# Patient Record
Sex: Male | Born: 1945
Health system: Southern US, Community
[De-identification: ages and names within clinical notes are randomized; demographics above are authoritative.]

## PROBLEM LIST (undated history)

## (undated) DIAGNOSIS — K5790 Diverticulosis of intestine, part unspecified, without perforation or abscess without bleeding: Secondary | ICD-10-CM

## (undated) DIAGNOSIS — J8410 Pulmonary fibrosis, unspecified: Secondary | ICD-10-CM

## (undated) DIAGNOSIS — N451 Epididymitis: Secondary | ICD-10-CM

## (undated) DIAGNOSIS — M48061 Spinal stenosis, lumbar region without neurogenic claudication: Secondary | ICD-10-CM

## (undated) DIAGNOSIS — D126 Benign neoplasm of colon, unspecified: Secondary | ICD-10-CM

## (undated) DIAGNOSIS — D3500 Benign neoplasm of unspecified adrenal gland: Secondary | ICD-10-CM

## (undated) DIAGNOSIS — K649 Unspecified hemorrhoids: Secondary | ICD-10-CM

## (undated) DIAGNOSIS — K648 Other hemorrhoids: Secondary | ICD-10-CM

## (undated) DIAGNOSIS — R7303 Prediabetes: Principal | ICD-10-CM

## (undated) DIAGNOSIS — D1803 Hemangioma of intra-abdominal structures: Secondary | ICD-10-CM

## (undated) DIAGNOSIS — N200 Calculus of kidney: Secondary | ICD-10-CM

## (undated) DIAGNOSIS — M722 Plantar fascial fibromatosis: Secondary | ICD-10-CM

## (undated) HISTORY — DX: Benign neoplasm of unspecified adrenal gland: D35.00

## (undated) HISTORY — DX: Spinal stenosis, lumbar region without neurogenic claudication: M48.061

## (undated) HISTORY — DX: Epididymitis: N45.1

## (undated) HISTORY — DX: Diverticulosis of intestine, part unspecified, without perforation or abscess without bleeding: K57.90

## (undated) HISTORY — DX: Calculus of kidney: N20.0

## (undated) HISTORY — DX: Prediabetes: R73.03

## (undated) HISTORY — DX: Benign neoplasm of colon, unspecified: D12.6

## (undated) HISTORY — PX: POLYPECTOMY: SHX149

## (undated) HISTORY — DX: Other hemorrhoids: K64.8

## (undated) HISTORY — PX: OTHER SURGICAL HISTORY: SHX169

## (undated) HISTORY — PX: COLONOSCOPY: SHX174

## (undated) HISTORY — DX: Unspecified hemorrhoids: K64.9

## (undated) HISTORY — DX: Hemangioma of intra-abdominal structures: D18.03

## (undated) HISTORY — DX: Pulmonary fibrosis, unspecified: J84.10

## (undated) HISTORY — DX: Plantar fascial fibromatosis: M72.2

---

## 2005-03-20 DIAGNOSIS — M48061 Spinal stenosis, lumbar region without neurogenic claudication: Secondary | ICD-10-CM

## 2005-03-20 HISTORY — DX: Spinal stenosis, lumbar region without neurogenic claudication: M48.061

## 2008-07-21 DIAGNOSIS — N451 Epididymitis: Secondary | ICD-10-CM

## 2008-07-21 DIAGNOSIS — M722 Plantar fascial fibromatosis: Secondary | ICD-10-CM

## 2008-07-21 HISTORY — DX: Epididymitis: N45.1

## 2008-07-21 HISTORY — DX: Plantar fascial fibromatosis: M72.2

## 2012-02-27 ENCOUNTER — Telehealth: Payer: Self-pay | Admitting: Internal Medicine

## 2012-02-27 ENCOUNTER — Encounter: Payer: Self-pay | Admitting: *Deleted

## 2012-02-27 NOTE — Telephone Encounter (Signed)
Received medical records from Dr. Claybon Jabs

## 2012-03-02 ENCOUNTER — Ambulatory Visit: Payer: Self-pay | Admitting: Internal Medicine

## 2012-03-15 ENCOUNTER — Ambulatory Visit (INDEPENDENT_AMBULATORY_CARE_PROVIDER_SITE_OTHER): Payer: Medicare Other | Admitting: Internal Medicine

## 2012-03-15 ENCOUNTER — Encounter: Payer: Self-pay | Admitting: Internal Medicine

## 2012-03-15 VITALS — BP 124/82 | HR 63 | Temp 98.4°F | Resp 16 | Ht 67.0 in | Wt 170.2 lb

## 2012-03-15 DIAGNOSIS — Z1211 Encounter for screening for malignant neoplasm of colon: Secondary | ICD-10-CM | POA: Insufficient documentation

## 2012-03-15 DIAGNOSIS — R7309 Other abnormal glucose: Secondary | ICD-10-CM | POA: Diagnosis not present

## 2012-03-15 DIAGNOSIS — R739 Hyperglycemia, unspecified: Secondary | ICD-10-CM | POA: Insufficient documentation

## 2012-03-15 DIAGNOSIS — R52 Pain, unspecified: Secondary | ICD-10-CM | POA: Diagnosis not present

## 2012-03-15 DIAGNOSIS — R1032 Left lower quadrant pain: Secondary | ICD-10-CM

## 2012-03-15 DIAGNOSIS — R7303 Prediabetes: Secondary | ICD-10-CM

## 2012-03-15 DIAGNOSIS — E559 Vitamin D deficiency, unspecified: Secondary | ICD-10-CM | POA: Diagnosis not present

## 2012-03-15 DIAGNOSIS — Z125 Encounter for screening for malignant neoplasm of prostate: Secondary | ICD-10-CM

## 2012-03-15 DIAGNOSIS — L989 Disorder of the skin and subcutaneous tissue, unspecified: Secondary | ICD-10-CM

## 2012-03-15 DIAGNOSIS — Z23 Encounter for immunization: Secondary | ICD-10-CM

## 2012-03-15 HISTORY — DX: Prediabetes: R73.03

## 2012-03-15 LAB — HEMOGLOBIN A1C: Hgb A1c MFr Bld: 6 % — ABNORMAL HIGH (ref <5.7)

## 2012-03-15 LAB — HEPATIC FUNCTION PANEL
ALT: 12 U/L (ref 0–53)
Bilirubin, Direct: 0.2 mg/dL (ref 0.0–0.3)
Total Bilirubin: 1.2 mg/dL (ref 0.3–1.2)

## 2012-03-15 LAB — CBC WITH DIFFERENTIAL/PLATELET
Basophils Absolute: 0 10*3/uL (ref 0.0–0.1)
Basophils Relative: 0 % (ref 0–1)
Eosinophils Relative: 2 % (ref 0–5)
HCT: 43.8 % (ref 39.0–52.0)
MCHC: 33.3 g/dL (ref 30.0–36.0)
MCV: 93 fL (ref 78.0–100.0)
Monocytes Absolute: 0.5 10*3/uL (ref 0.1–1.0)
RDW: 13 % (ref 11.5–15.5)

## 2012-03-15 LAB — BASIC METABOLIC PANEL
BUN: 20 mg/dL (ref 6–23)
Creat: 0.99 mg/dL (ref 0.50–1.35)

## 2012-03-15 MED ORDER — AMBIEN 10 MG PO TABS: 10.0000 mg | ORAL_TABLET | Freq: Every evening | ORAL | Status: DC | PRN

## 2012-03-15 NOTE — Assessment & Plan Note (Signed)
Obtain CBC, Chem-7, liver function test and urinalysis. Refer to GI for updated colonoscopy. Pending lab results have discussed possible abdominal pelvic CT for further evaluation. Close followup scheduled

## 2012-03-15 NOTE — Assessment & Plan Note (Signed)
-   Obtain vitamin D level

## 2012-03-15 NOTE — Assessment & Plan Note (Signed)
Obtain Chem-7 and A1c 

## 2012-03-15 NOTE — Progress Notes (Signed)
  Subjective:    Patient ID: Daniel Rasmussen, male    DOB: 1945-12-10, 66 y.o.   MRN: [id number]  HPI patient presents to clinic to establish care and for evaluation of left lower cord and abdominal pain. Notes low-level discomfort persistent left lower quadrant for over one year. Pain does not radiate and is not associated with any change in bowel habits. Denies hematochezia or melena. Recalls [place]. Reviewed outside labs dated two thousand eleven with normal CBC, LDL, creatinine, TSH, free T4 and PSA of point nine eight. A1c mildly elevated at six point two and vitamin D level depressed at seventeen. Not currently taking vitamin D supplementation. Has history of elevated bilirubin last known to be one point five. Previously scheduled for abdominal ultrasound by PMD but was not performed. States does drink wine on a daily basis. Request refill of Ambien taken when necessary for insomnia without adverse effect.  Past Medical History  Diagnosis Date  . Renal calculi   . Lumbar strain   . Spinal stenosis of lumbar region 10.2006    L4-L5, Mild discogenic disease  . Borderline hypertension 11.2006  . Hemorrhoid     Inflammation  . Sprain of medial meniscus 02.2010    Right  . Plantar fasciitis 02.2010  . Epididymitis 02.2010   No past surgical history on file.  reports that he has quit smoking. He does not have any smokeless tobacco history on file. His alcohol and drug histories not on file. family history includes Aortic aneurysm in his father; Breast cancer in his mother; and Healthy in his sister.  There is no history of Colon cancer and Prostate cancer. No Known Allergies  Review of Systems  Gastrointestinal: Positive for abdominal pain. Negative for diarrhea, constipation and blood in stool.  Skin: Negative for rash.  All other systems reviewed and are negative.       Objective:   Physical Exam  Nursing note and vitals  reviewed. Constitutional: He appears well-developed and well-nourished. No distress.  HENT:  Head: Normocephalic and atraumatic.  Right Ear: External ear normal.  Left Ear: External ear normal.  Eyes: Conjunctivae normal are normal. No scleral icterus.  Neck: Neck supple.  Cardiovascular: Normal rate, regular rhythm and normal heart sounds.   Pulmonary/Chest: Effort normal and breath sounds normal. No respiratory distress. He has no wheezes. He has no rales.  Abdominal: Soft. Normal appearance and bowel sounds are normal. He exhibits no distension and no mass. There is no hepatosplenomegaly. There is tenderness in the left lower quadrant. There is no rebound and no guarding.  Neurological: He is alert.  Skin: Skin is warm and dry. He is not diaphoretic.       Left anterior chest-small nodular skin growth approximately 5 mm in transverse diameter deeply hyperpigmented. Patient indicates there has been some change in this lesion recently  Psychiatric: He has a normal mood and affect.          Assessment & Plan:

## 2012-03-15 NOTE — Assessment & Plan Note (Signed)
Dermatology consult

## 2012-03-16 DIAGNOSIS — L578 Other skin changes due to chronic exposure to nonionizing radiation: Secondary | ICD-10-CM | POA: Diagnosis not present

## 2012-03-16 DIAGNOSIS — L819 Disorder of pigmentation, unspecified: Secondary | ICD-10-CM | POA: Diagnosis not present

## 2012-03-16 DIAGNOSIS — D485 Neoplasm of uncertain behavior of skin: Secondary | ICD-10-CM | POA: Diagnosis not present

## 2012-03-16 DIAGNOSIS — L821 Other seborrheic keratosis: Secondary | ICD-10-CM | POA: Diagnosis not present

## 2012-03-16 LAB — PSA, MEDICARE: PSA: 0.84 ng/mL (ref <=4.00)

## 2012-03-16 LAB — URINALYSIS, ROUTINE W REFLEX MICROSCOPIC
Glucose, UA: NEGATIVE mg/dL
Leukocytes, UA: NEGATIVE
Nitrite: NEGATIVE
Protein, ur: NEGATIVE mg/dL
pH: 6 (ref 5.0–8.0)

## 2012-03-16 NOTE — Addendum Note (Signed)
Addended by: Regis Bill on: 03/16/2012 11:57 AM   Modules accepted: Orders

## 2012-04-02 ENCOUNTER — Encounter: Payer: Self-pay | Admitting: Internal Medicine

## 2012-04-02 ENCOUNTER — Ambulatory Visit (INDEPENDENT_AMBULATORY_CARE_PROVIDER_SITE_OTHER): Payer: BLUE CROSS/BLUE SHIELD | Admitting: Internal Medicine

## 2012-04-02 VITALS — BP 126/66 | HR 76 | Temp 98.5°F | Resp 16 | Wt 168.0 lb

## 2012-04-02 DIAGNOSIS — R52 Pain, unspecified: Secondary | ICD-10-CM | POA: Diagnosis not present

## 2012-04-02 DIAGNOSIS — E559 Vitamin D deficiency, unspecified: Secondary | ICD-10-CM | POA: Diagnosis not present

## 2012-04-02 DIAGNOSIS — R1032 Left lower quadrant pain: Secondary | ICD-10-CM | POA: Diagnosis not present

## 2012-04-02 DIAGNOSIS — R7309 Other abnormal glucose: Secondary | ICD-10-CM

## 2012-04-02 DIAGNOSIS — R739 Hyperglycemia, unspecified: Secondary | ICD-10-CM

## 2012-04-02 NOTE — Patient Instructions (Signed)
Please schedule fasting labs prior to next visit a1c-hyperglycemia, lipid-cholesterol screening and vitamin d -vit d deficiency

## 2012-04-02 NOTE — Assessment & Plan Note (Signed)
Recommend decrease intake of sugar and carbohydrates. Maintain regular aerobic exercise. Recheck A1c with next visit

## 2012-04-02 NOTE — Assessment & Plan Note (Signed)
Again over-the-counter vitamin D eight hundred units daily. Recheck vitamin D level with next visit

## 2012-04-02 NOTE — Progress Notes (Signed)
  Subjective:    Patient ID: Daniel Rasmussen, male    DOB: December 02, 1945, 66 y.o.   MRN: [id number]  HPI Pt presents to clinic for followup of multiple medical problems. Notes continued mild intermittent left lower quadrant pain. Has not changed in character or frequency. Has GI consultation pending. Did see dermatology for skin growth and reports excision with benign pathology. Reviewed labs including mildly elevated A1c and mildly low/low normal vitamin D level.  Past Medical History  Diagnosis Date  . Renal calculi   . Lumbar strain   . Spinal stenosis of lumbar region 10.2006    L4-L5, Mild discogenic disease  . Borderline hypertension 11.2006  . Hemorrhoid     Inflammation  . Sprain of medial meniscus 02.2010    Right  . Plantar fasciitis 02.2010  . Epididymitis 02.2010   No past surgical history on file.  reports that he has quit smoking. He does not have any smokeless tobacco history on file. His alcohol and drug histories not on file. family history includes Aortic aneurysm in his father; Breast cancer in his mother; and Healthy in his sister.  There is no history of Colon cancer and Prostate cancer. No Known Allergies    Review of Systems see hpi    Objective:   Physical Exam  Nursing note and vitals reviewed. Constitutional: He appears well-developed and well-nourished. No distress.  HENT:  Head: Normocephalic and atraumatic.  Neurological: He is alert.  Skin: He is not diaphoretic.  Psychiatric: He has a normal mood and affect.          Assessment & Plan:

## 2012-04-02 NOTE — Assessment & Plan Note (Signed)
Stable. GI consultation and possible colonoscopy pending.

## 2012-04-03 ENCOUNTER — Telehealth: Payer: Self-pay | Admitting: Internal Medicine

## 2012-04-03 NOTE — Telephone Encounter (Signed)
Lab order 09-24-2012 a1c-hyperglycemia, lipid-cholesterol screening and vitamin d -vit d deficiency

## 2012-04-17 ENCOUNTER — Encounter: Payer: BLUE CROSS/BLUE SHIELD | Admitting: Internal Medicine

## 2012-04-19 ENCOUNTER — Ambulatory Visit (AMBULATORY_SURGERY_CENTER): Payer: BLUE CROSS/BLUE SHIELD | Admitting: *Deleted

## 2012-04-19 VITALS — Ht 67.0 in | Wt 174.0 lb

## 2012-04-19 DIAGNOSIS — R1032 Left lower quadrant pain: Secondary | ICD-10-CM

## 2012-04-19 DIAGNOSIS — Z1211 Encounter for screening for malignant neoplasm of colon: Secondary | ICD-10-CM

## 2012-04-19 MED ORDER — MOVIPREP 100 G PO SOLR: 1.0000 | Freq: Once | ORAL | Status: DC

## 2012-05-03 ENCOUNTER — Ambulatory Visit (AMBULATORY_SURGERY_CENTER): Payer: BLUE CROSS/BLUE SHIELD | Admitting: Internal Medicine

## 2012-05-03 ENCOUNTER — Encounter: Payer: Self-pay | Admitting: Internal Medicine

## 2012-05-03 VITALS — BP 107/72 | HR 67 | Temp 97.5°F | Resp 30 | Ht 67.0 in | Wt 174.0 lb

## 2012-05-03 DIAGNOSIS — Z1211 Encounter for screening for malignant neoplasm of colon: Secondary | ICD-10-CM

## 2012-05-03 DIAGNOSIS — D126 Benign neoplasm of colon, unspecified: Secondary | ICD-10-CM

## 2012-05-03 DIAGNOSIS — R1032 Left lower quadrant pain: Secondary | ICD-10-CM | POA: Diagnosis not present

## 2012-05-03 MED ORDER — SODIUM CHLORIDE 0.9 % IV SOLN: 500.0000 mL | INTRAVENOUS | Status: DC

## 2012-05-03 NOTE — Patient Instructions (Addendum)

## 2012-05-03 NOTE — Op Note (Signed)
 [facility] [address] [place], [address]   COLONOSCOPY PROCEDURE REPORT  PATIENT: Daniel Rasmussen, Daniel Rasmussen  MR#: [id number] BIRTHDATE: 09/08/1945 , 66  yrs. old GENDER: Male ENDOSCOPIST: Beverley Fiedler, MD REFERRED [id number], Maisie Fus PROCEDURE DATE:  05/03/2012 PROCEDURE:   Colonoscopy with cold biopsy polypectomy and Colonoscopy with snare polypectomy ASA CLASS:   Class II INDICATIONS:average risk screening, last colonoscopy performed 1999, and abdominal pain in the lower left quadrant. MEDICATIONS: MAC sedation, administered by CRNA and propofol (Diprivan) 400mg  IV  DESCRIPTION OF PROCEDURE:   After the risks benefits and alternatives of the procedure were thoroughly explained, informed consent was obtained.  A digital rectal exam revealed no rectal mass and A digital rectal exam revealed several skin tags.   The LB [id number]  endoscope was introduced through the anus and advanced to the terminal ileum which was intubated for a short distance. No adverse events experienced.   The quality of the prep was Suprep good  The instrument was then slowly withdrawn as the colon was fully examined.   COLON FINDINGS: The mucosa appeared normal in the terminal ileum. A sessile polyp measuring 3 mm in size was found at the cecum.  A polypectomy was performed with cold forceps.  The resection was complete and the polyp tissue was completely retrieved.   A sessile polyp measuring 8 mm in size was found in the proximal transverse colon.  A polypectomy was performed using snare cautery.  The resection was complete and the polyp tissue was completely retrieved.   Five polyps measuring 3-6 mm in size were found in the descending colon, sigmoid colon, and rectum.  A polypectomy was performed with cold forceps (1) and with a cold snare (4).  The resection was complete and the polyp tissue was completely retrieved.   Mild diverticulosis was noted in the descending colon and  sigmoid colon.  Retroflexed views revealed no abnormalities. The time to cecum=4 minutes 20 seconds.  Withdrawal time=18 minutes 50 seconds.  The scope was withdrawn and the procedure completed.  COMPLICATIONS: There were no complications.  ENDOSCOPIC IMPRESSION: 1.   Normal mucosa in the terminal ileum 2.   Sessile polyp measuring 3 mm in size was found at the cecum; polypectomy was performed with cold forceps 3.   Sessile polyp measuring 8 mm in size was found in the proximal transverse colon; polypectomy was performed using snare cautery 4.   Five polyps measuring 3-6 mm in size were found in the descending colon, sigmoid colon, and rectum; polypectomy was performed with cold forceps and with a cold snare 5.   Mild diverticulosis was noted in the descending colon and sigmoid colon     RECOMMENDATIONS: 1.  Await pathology results 2.  High fiber diet 3.  Timing of repeat colonoscopy will be determined by pathology findings. 4.  You will receive a letter within 1-2 weeks with the results of your biopsy as well as final recommendations.  Please call my office if you have not received a letter after 3 weeks.   eSigned:  Beverley Fiedler, MD 05/03/2012 11:27 AM   cc: Charlynn Court MD and The Patient   PATIENT NAME:  [name], [name] MR#: [id number]

## 2012-05-03 NOTE — Progress Notes (Signed)
Patient did not experience any of the following events: a burn prior to discharge; a fall within the facility; wrong site/side/patient/procedure/implant event; or a hospital transfer or hospital admission upon discharge from the facility. (G8907) Patient did not have preoperative order for IV antibiotic SSI prophylaxis. (G8918)  

## 2012-05-04 ENCOUNTER — Telehealth: Payer: Self-pay | Admitting: *Deleted

## 2012-05-04 NOTE — Telephone Encounter (Signed)
  Follow up Call-  Call back number 05/03/2012  Post procedure Call Back phone  # [phone]  Permission to leave phone message Yes     Patient questions:  Do you have a fever, pain , or abdominal swelling? no Pain Score  0 *  Have you tolerated food without any problems? yes  Have you been able to return to your normal activities? yes  Do you have any questions about your discharge instructions: Diet   no Medications  no Follow up visit  no  Do you have questions or concerns about your Care? no  Actions: * If pain score is 4 or above: No action needed, pain <4.

## 2012-05-08 ENCOUNTER — Encounter: Payer: Self-pay | Admitting: Internal Medicine

## 2012-10-02 ENCOUNTER — Ambulatory Visit: Payer: Medicare Other | Admitting: Internal Medicine

## 2012-10-04 ENCOUNTER — Ambulatory Visit: Payer: BLUE CROSS/BLUE SHIELD | Admitting: Family Medicine

## 2012-12-22 DIAGNOSIS — K7689 Other specified diseases of liver: Secondary | ICD-10-CM | POA: Diagnosis not present

## 2012-12-22 DIAGNOSIS — R0602 Shortness of breath: Secondary | ICD-10-CM | POA: Diagnosis not present

## 2012-12-22 DIAGNOSIS — R079 Chest pain, unspecified: Secondary | ICD-10-CM | POA: Diagnosis not present

## 2012-12-22 DIAGNOSIS — K219 Gastro-esophageal reflux disease without esophagitis: Secondary | ICD-10-CM | POA: Diagnosis not present

## 2012-12-22 DIAGNOSIS — R1013 Epigastric pain: Secondary | ICD-10-CM | POA: Diagnosis not present

## 2012-12-22 DIAGNOSIS — R110 Nausea: Secondary | ICD-10-CM | POA: Diagnosis not present

## 2012-12-25 ENCOUNTER — Encounter: Payer: Self-pay | Admitting: Internal Medicine

## 2012-12-25 ENCOUNTER — Ambulatory Visit (INDEPENDENT_AMBULATORY_CARE_PROVIDER_SITE_OTHER): Payer: Medicare Other | Admitting: Internal Medicine

## 2012-12-25 VITALS — BP 122/78 | HR 87 | Temp 98.2°F | Wt 177.0 lb

## 2012-12-25 DIAGNOSIS — D3500 Benign neoplasm of unspecified adrenal gland: Secondary | ICD-10-CM | POA: Insufficient documentation

## 2012-12-25 DIAGNOSIS — M48061 Spinal stenosis, lumbar region without neurogenic claudication: Secondary | ICD-10-CM | POA: Insufficient documentation

## 2012-12-25 DIAGNOSIS — J984 Other disorders of lung: Secondary | ICD-10-CM

## 2012-12-25 DIAGNOSIS — D1803 Hemangioma of intra-abdominal structures: Secondary | ICD-10-CM

## 2012-12-25 DIAGNOSIS — J8410 Pulmonary fibrosis, unspecified: Secondary | ICD-10-CM

## 2012-12-25 DIAGNOSIS — R1013 Epigastric pain: Secondary | ICD-10-CM | POA: Diagnosis not present

## 2012-12-25 DIAGNOSIS — D3502 Benign neoplasm of left adrenal gland: Secondary | ICD-10-CM

## 2012-12-25 HISTORY — DX: Pulmonary fibrosis, unspecified: J84.10

## 2012-12-25 HISTORY — DX: Hemangioma of intra-abdominal structures: D18.03

## 2012-12-25 HISTORY — DX: Benign neoplasm of unspecified adrenal gland: D35.00

## 2012-12-25 NOTE — Patient Instructions (Addendum)
If you activate the  My Chart system; lab & Xray results will be released directly  to you as soon as I review & address these through the computer. If you choose not to sign up for My Chart within 36 hours of labs being drawn; results will be reviewed & interpretation added before being copied & mailed, causing a delay in getting the results to you.If you do not receive that report within 7-10 days ,please call. Additionally you can use this system to gain direct  access to your records  if  out of town or @ an office of a  physician who is not in  the My Chart network.  This improves continuity of care & places you in control of your medical record.   Report Warning Signs as discussed, especially shortness of breath.If this appears, please have D dimer collected.

## 2012-12-25 NOTE — Progress Notes (Signed)
Subjective:    Patient ID: Daniel Rasmussen, male    DOB: Oct 24, 1945, 67 y.o.   MRN: [id number]  HPI   The 12/22/12 emergency room visit was reviewed. He presented with epigastric pressure and dyspnea. He gone to the emergency room because a friend had told him he exhibited pallor. At that time the abdominal discomfort was described as a level IV.  He questioned whether he had pulled some abdominal muscles lifting a desk.  The pain had been present intermittently over the previous 2 weeks.  He had driven from [place] during the second week  of abdominal discomfort.  In the emergency room extensive labs and imaging were performed. Troponin was negative. D-dimer was not performed. White blood count was 3100 and platelet count 144,000. The remainder of the CBC and differential and chemistries are normal.  Chest x-ray revealed calcified granuloma in the posterior right base. There was eventration of the right hemidiaphragm.  CT of the abdomen and pelvis revealed moderate spinal stenosis at L2-3 due to central calcified disc protrusion. There is a small left adrenal adenoma & suggestion of @ one small liver hemangioma.    Review of Systems  Within the last 3 months he had a colonoscopy which was negative.  Since the emergency room visit he has had minor residual epigastric symptoms described as "soreness/tightness" up to level II. He feels that he responded to the apparent PPI medication prescribed @ the emergency room. He has no residual dyspnea.  He denies fever, chills, sweats, or weight loss. He's had no cough or hemoptysis.     Objective:   Physical Exam Gen.: Healthy and well-nourished in appearance. Alert, appropriate and cooperative throughout exam. Eyes: No corneal or conjunctival inflammation noted. No icterus Ears: External  ear exam reveals no significant lesions or deformities. Canals clear .TMs normal. Hearing is grossly normal bilaterally. Nose: External nasal exam  reveals no deformity or inflammation. Nasal mucosa are pink and moist. No lesions or exudates noted.  Mouth: Oral mucosa and oropharynx reveal no lesions or exudates. Teeth in good repair. Neck: No deformities, masses, or tenderness noted.  Thyroid normal. Lungs: Normal respiratory effort; chest expands symmetrically. Lungs are clear to auscultation without rales, wheezes, or increased work of breathing. Heart: Normal rate and rhythm. Normal S1 ;split S2. No gallop, click, or rub. No murmur. Abdomen: Bowel sounds normal; abdomen soft but slightly tender LLQ. No masses, organomegaly or hernias noted.                                 Musculoskeletal/extremities: No deformity or scoliosis noted of  the thoracic or lumbar spine.  No clubbing, cyanosis, edema, or significant extremity  deformity noted. Range of motion normal .Tone & strength  Normal. Joints normal . Nail health good. Able to lie down & sit up w/o help. Negative SLR bilaterally. Homan's negative Vascular: Carotid, radial artery, dorsalis pedis and  posterior tibial pulses are full and equal. No bruits present. Neurologic: Alert and oriented x3.         Skin: Intact without suspicious lesions or rashes. Lymph: No cervical, axillary lymphadenopathy present. Psych: Mood and affect are normal. Normally interactive  Assessment & Plan:  #1 atypical epigastric pain; ? Hiatal hernia #2 See Current Assessment & Plan in Problem List under specific Diagnosis

## 2013-01-07 ENCOUNTER — Ambulatory Visit: Payer: Medicare Other | Admitting: Internal Medicine

## 2013-01-08 ENCOUNTER — Encounter: Payer: Self-pay | Admitting: Family Medicine

## 2013-06-24 DIAGNOSIS — Z23 Encounter for immunization: Secondary | ICD-10-CM | POA: Diagnosis not present

## 2013-09-30 DIAGNOSIS — R7309 Other abnormal glucose: Secondary | ICD-10-CM | POA: Diagnosis not present

## 2013-09-30 DIAGNOSIS — E162 Hypoglycemia, unspecified: Secondary | ICD-10-CM | POA: Diagnosis not present

## 2013-09-30 DIAGNOSIS — G4700 Insomnia, unspecified: Secondary | ICD-10-CM | POA: Diagnosis not present

## 2013-09-30 DIAGNOSIS — R0602 Shortness of breath: Secondary | ICD-10-CM | POA: Diagnosis not present

## 2014-01-09 DIAGNOSIS — H01009 Unspecified blepharitis unspecified eye, unspecified eyelid: Secondary | ICD-10-CM | POA: Diagnosis not present

## 2014-03-28 DIAGNOSIS — D72829 Elevated white blood cell count, unspecified: Secondary | ICD-10-CM | POA: Diagnosis not present

## 2014-03-28 DIAGNOSIS — R040 Epistaxis: Secondary | ICD-10-CM | POA: Diagnosis not present

## 2014-03-29 DIAGNOSIS — R040 Epistaxis: Secondary | ICD-10-CM | POA: Diagnosis not present

## 2014-03-29 DIAGNOSIS — R0981 Nasal congestion: Secondary | ICD-10-CM | POA: Diagnosis not present

## 2014-07-09 DIAGNOSIS — Z23 Encounter for immunization: Secondary | ICD-10-CM | POA: Diagnosis not present

## 2014-07-22 ENCOUNTER — Ambulatory Visit: Payer: BLUE CROSS/BLUE SHIELD | Admitting: Internal Medicine

## 2014-07-30 ENCOUNTER — Ambulatory Visit (INDEPENDENT_AMBULATORY_CARE_PROVIDER_SITE_OTHER): Payer: Medicare Other | Admitting: Internal Medicine

## 2014-07-30 ENCOUNTER — Encounter: Payer: Self-pay | Admitting: Internal Medicine

## 2014-07-30 VITALS — BP 134/80 | HR 59 | Temp 98.0°F | Ht 67.0 in | Wt 182.4 lb

## 2014-07-30 DIAGNOSIS — Z23 Encounter for immunization: Secondary | ICD-10-CM | POA: Diagnosis not present

## 2014-07-30 DIAGNOSIS — R030 Elevated blood-pressure reading, without diagnosis of hypertension: Secondary | ICD-10-CM | POA: Diagnosis not present

## 2014-07-30 DIAGNOSIS — D1803 Hemangioma of intra-abdominal structures: Secondary | ICD-10-CM

## 2014-07-30 DIAGNOSIS — R7303 Prediabetes: Secondary | ICD-10-CM

## 2014-07-30 DIAGNOSIS — Z130 Encounter for screening for diseases of the blood and blood-forming organs and certain disorders involving the immune mechanism: Secondary | ICD-10-CM

## 2014-07-30 DIAGNOSIS — R251 Tremor, unspecified: Secondary | ICD-10-CM

## 2014-07-30 DIAGNOSIS — Z125 Encounter for screening for malignant neoplasm of prostate: Secondary | ICD-10-CM

## 2014-07-30 DIAGNOSIS — R7309 Other abnormal glucose: Secondary | ICD-10-CM

## 2014-07-30 DIAGNOSIS — Z0000 Encounter for general adult medical examination without abnormal findings: Secondary | ICD-10-CM

## 2014-07-30 LAB — CBC WITH DIFFERENTIAL/PLATELET
BASOS PCT: 0.3 % (ref 0.0–3.0)
Basophils Absolute: 0 10*3/uL (ref 0.0–0.1)
EOS ABS: 0.1 10*3/uL (ref 0.0–0.7)
Eosinophils Relative: 2.9 % (ref 0.0–5.0)
HCT: 43.9 % (ref 39.0–52.0)
HEMOGLOBIN: 14.9 g/dL (ref 13.0–17.0)
LYMPHS PCT: 28.1 % (ref 12.0–46.0)
Lymphs Abs: 1.1 10*3/uL (ref 0.7–4.0)
MCHC: 34 g/dL (ref 30.0–36.0)
MCV: 89.5 fl (ref 78.0–100.0)
MONOS PCT: 11.2 % (ref 3.0–12.0)
Monocytes Absolute: 0.4 10*3/uL (ref 0.1–1.0)
Neutro Abs: 2.2 10*3/uL (ref 1.4–7.7)
Neutrophils Relative %: 57.5 % (ref 43.0–77.0)
PLATELETS: 174 10*3/uL (ref 150.0–400.0)
RBC: 4.91 Mil/uL (ref 4.22–5.81)
RDW: 13.4 % (ref 11.5–15.5)
WBC: 3.9 10*3/uL — ABNORMAL LOW (ref 4.0–10.5)

## 2014-07-30 LAB — COMPREHENSIVE METABOLIC PANEL
ALBUMIN: 4.4 g/dL (ref 3.5–5.2)
ALT: 19 U/L (ref 0–53)
AST: 23 U/L (ref 0–37)
Alkaline Phosphatase: 55 U/L (ref 39–117)
BILIRUBIN TOTAL: 1.3 mg/dL — AB (ref 0.2–1.2)
BUN: 15 mg/dL (ref 6–23)
CALCIUM: 9.3 mg/dL (ref 8.4–10.5)
CHLORIDE: 102 meq/L (ref 96–112)
CO2: 32 mEq/L (ref 19–32)
CREATININE: 0.79 mg/dL (ref 0.40–1.50)
GFR: 103.81 mL/min (ref >60.00)
GLUCOSE: 100 mg/dL — AB (ref 70–99)
POTASSIUM: 4.4 meq/L (ref 3.5–5.1)
SODIUM: 136 meq/L (ref 135–145)
Total Protein: 6.7 g/dL (ref 6.0–8.3)

## 2014-07-30 LAB — LIPID PANEL
CHOL/HDL RATIO: 4
CHOLESTEROL: 224 mg/dL — AB (ref 0–200)
HDL: 61.2 mg/dL (ref >39.00)
LDL CALC: 136 mg/dL — AB (ref 0–99)
NONHDL: 162.8
TRIGLYCERIDES: 133 mg/dL (ref 0.0–149.0)
VLDL: 26.6 mg/dL (ref 0.0–40.0)

## 2014-07-30 LAB — TSH: TSH: 1.53 u[IU]/mL (ref 0.35–4.50)

## 2014-07-30 LAB — HEMOGLOBIN A1C: Hgb A1c MFr Bld: 6 % (ref 4.6–6.5)

## 2014-07-30 LAB — PSA: PSA: 1.19 ng/mL (ref 0.10–4.00)

## 2014-07-30 MED ORDER — ZOSTER VACCINE LIVE 19400 UNT/0.65ML ~~LOC~~ SOLR: 0.6500 mL | Freq: Once | SUBCUTANEOUS | Status: DC

## 2014-07-30 NOTE — Assessment & Plan Note (Signed)
A1c in 2013 was 6.0, patient is doing well with diet and exercise. Plan: Labs

## 2014-07-30 NOTE — Patient Instructions (Signed)
Get your blood work before you leave    Please come back to the office in 1 year for a physical exam. Come back fasting        Fall Prevention and Home Safety Falls cause injuries and can affect all age groups. It is possible to use preventive measures to significantly decrease the likelihood of falls. There are many simple measures which can make your home safer and prevent falls. OUTDOORS  Repair cracks and edges of walkways and driveways.  Remove high doorway thresholds.  Trim shrubbery on the main path into your home.  Have good outside lighting.  Clear walkways of tools, rocks, debris, and clutter.  Check that handrails are not broken and are securely fastened. Both sides of steps should have handrails.  Have leaves, snow, and ice cleared regularly.  Use sand or salt on walkways during winter months.  In the garage, clean up grease or oil spills. BATHROOM  Install night lights.  Install grab bars by the toilet and in the tub and shower.  Use non-skid mats or decals in the tub or shower.  Place a plastic non-slip stool in the shower to sit on, if needed.  Keep floors dry and clean up all water on the floor immediately.  Remove soap buildup in the tub or shower on a regular basis.  Secure bath mats with non-slip, double-sided rug tape.  Remove throw rugs and tripping hazards from the floors. BEDROOMS  Install night lights.  Make sure a bedside light is easy to reach.  Do not use oversized bedding.  Keep a telephone by your bedside.  Have a firm chair with side arms to use for getting dressed.  Remove throw rugs and tripping hazards from the floor. KITCHEN  Keep handles on pots and pans turned toward the center of the stove. Use back burners when possible.  Clean up spills quickly and allow time for drying.  Avoid walking on wet floors.  Avoid hot utensils and knives.  Position shelves so they are not too high or low.  Place commonly used  objects within easy reach.  If necessary, use a sturdy step stool with a grab bar when reaching.  Keep electrical cables out of the way.  Do not use floor polish or wax that makes floors slippery. If you must use wax, use non-skid floor wax.  Remove throw rugs and tripping hazards from the floor. STAIRWAYS  Never leave objects on stairs.  Place handrails on both sides of stairways and use them. Fix any loose handrails. Make sure handrails on both sides of the stairways are as long as the stairs.  Check carpeting to make sure it is firmly attached along stairs. Make repairs to worn or loose carpet promptly.  Avoid placing throw rugs at the top or bottom of stairways, or properly secure the rug with carpet tape to prevent slippage. Get rid of throw rugs, if possible.  Have an electrician put in a light switch at the top and bottom of the stairs. OTHER FALL PREVENTION TIPS  Wear low-heel or rubber-soled shoes that are supportive and fit well. Wear closed toe shoes.  When using a stepladder, make sure it is fully opened and both spreaders are firmly locked. Do not climb a closed stepladder.  Add color or contrast paint or tape to grab bars and handrails in your home. Place contrasting color strips on first and last steps.  Learn and use mobility aids as needed. Install an electrical emergency response system.    Turn on lights to avoid dark areas. Replace light bulbs that burn out immediately. Get light switches that glow.  Arrange furniture to create clear pathways. Keep furniture in the same place.  Firmly attach carpet with non-skid or double-sided tape.  Eliminate uneven floor surfaces.  Select a carpet pattern that does not visually hide the edge of steps.  Be aware of all pets. OTHER HOME SAFETY TIPS  Set the water temperature for 120 F (48.8 C).  Keep emergency numbers on or near the telephone.  Keep smoke detectors on every level of the home and near sleeping  areas. Document Released: 05/27/2002 Document Revised: 12/06/2011 Document Reviewed: 08/26/2011 Exit[employer] Patient Information 2015 [employer]. This information is not intended to replace advice given to you by your health care provider. Make sure you discuss any questions you have with your health care provider.   Preventive Care for Adults   Ages 65 and over  Blood pressure check.** / Every 1 to 2 years.  Lipid and cholesterol check.**/ Every 5 years beginning at age 20.  Lung cancer screening. / Every year if you are aged 55-80 years and have a 30-pack-year history of smoking and currently smoke or have quit within the past 15 years. Yearly screening is stopped once you have quit smoking for at least 15 years or develop a health problem that would prevent you from having lung cancer treatment.  Fecal occult blood test (FOBT) of stool. / Every year beginning at age 50 and continuing until age 75. You may not have to do this test if you get a colonoscopy every 10 years.  Flexible sigmoidoscopy** or colonoscopy.** / Every 5 years for a flexible sigmoidoscopy or every 10 years for a colonoscopy beginning at age 50 and continuing until age 75.  Hepatitis C blood test.** / For all people born from 1945 through 1965 and any individual with known risks for hepatitis C.  Abdominal aortic aneurysm (AAA) screening.** / A one-time screening for ages 65 to 75 years who are current or former smokers.  Skin self-exam. / Monthly.  Influenza vaccine. / Every year.  Tetanus, diphtheria, and acellular pertussis (Tdap/Td) vaccine.** / 1 dose of Td every 10 years.  Varicella vaccine.** / Consult your health care provider.  Zoster vaccine.** / 1 dose for adults aged 60 years or older.  Pneumococcal 13-valent conjugate (PCV13) vaccine.** / Consult your health care provider.  Pneumococcal polysaccharide (PPSV23) vaccine.** / 1 dose for all adults aged 65 years and older.  Meningococcal vaccine.** /  Consult your health care provider.  Hepatitis A vaccine.** / Consult your health care provider.  Hepatitis B vaccine.** / Consult your health care provider.  Haemophilus influenzae type b (Hib) vaccine.** / Consult your health care provider. **Family history and personal history of risk and conditions may change your health care provider's recommendations. Document Released: 08/02/2001 Document Revised: 06/11/2013 Document Reviewed: 11/01/2010 Exit[employer] Patient Information 2015 [employer]. This information is not intended to replace advice given to you by your health care provider. Make sure you discuss any questions you have with your health care provider.  

## 2014-07-30 NOTE — Assessment & Plan Note (Addendum)
Td-- today Prevnar-- today Pneumonia shot --next year Zostavax discussed, prescription provided  Prostate cancer screening: DRE normal today, check a PSA  Colon cancer screening: Colonoscopy 04-2012 show polyps, next due 04-2015. Patient aware  Diet and exercise discussed  Also, patient had [employer] screening: Normal carotid artery disease, abdominal aortic aneurysm, osteoporosis and peripheral vascular disease screenings.

## 2014-07-30 NOTE — Progress Notes (Signed)
Pre visit review using our clinic review tool, if applicable. No additional management support is needed unless otherwise documented below in the visit note. 

## 2014-07-30 NOTE — Assessment & Plan Note (Signed)
Chart reviewed: 12/22/12 incidental finding of 3 small masses in the anterior segment of the right lobe of the liver which probably represent hemangioma. The largest lesion measured 1.4 x 1.0 cm in size. Followup recommended in 06/2013 to confirm stability  Plan: Check a ultrasound

## 2014-07-30 NOTE — Assessment & Plan Note (Signed)
Mild head tremor noted, check a TSH. Was unaware of symptoms

## 2014-07-30 NOTE — Progress Notes (Signed)
Subjective:    Patient ID: Daniel Rasmussen, male    DOB: 07/02/1945, 69 y.o.   MRN: [id number]  DOS:  07/30/2014 Type of visit - description : new pt to me  Here for Medicare AWV:  1. Risk factors based on Past M, S, F history: reviewed 2. Physical Activities: trying to be active, takes walks often  3. Depression/mood: neg screening  4. Hearing:  No problems noted or reported  5. ADL's: independent, drives  6. Fall Risk: no recent falls, prevention discussed  7. home Safety: does feel safe at home  8. Height, weight, & visual acuity: see VS, sees eye doctor regularly (just got a new rx) 9. Counseling: provided 10. Labs ordered based on risk factors: if needed  11. Referral Coordination: if needed 12. Care Plan, see assessment and plan , written personalized plan provided  13. Cognitive Assessment: motor skills and cognition appropriate for age 22. Care team updated-- does not see other MDS   In addition, today we discussed the following: History of prediabetes, trying to stay active and eat healthy. Insomnia, not a major issue at this time, at some point he was prescribed Ambien. History of low back pain, from time to time has problems. History of kidney stones, no recent problems. I notice some tremor in the head, patient was not aware.  Review of Systems  Constitutional: Negative for diaphoresis, appetite change and unexpected weight change.  HENT: Negative for dental problem, ear discharge, facial swelling, trouble swallowing and voice change.   Eyes: Negative for photophobia, discharge and redness.  Respiratory:       No cough or sputum production. No wheezing or SOB  Cardiovascular:       No CP No edema or  palpitations   Gastrointestinal: Negative for nausea, vomiting, abdominal pain and diarrhea.       No blood in the stools   Endocrine: Negative for polydipsia, polyphagia and polyuria.  Genitourinary: Negative for urgency, frequency, hematuria and difficulty  urinating.       No dysuria  Musculoskeletal: Negative for joint swelling.       No unusual aches or pains   Skin: Negative for color change, pallor and rash.  Allergic/Immunologic: Negative for environmental allergies and food allergies.  Neurological: Negative for dizziness and syncope.       No headaches   Hematological: Negative for adenopathy. Does not bruise/bleed easily.  Psychiatric/Behavioral: Negative for suicidal ideas, hallucinations, behavioral problems and confusion.       No unusual or severe anxiety-depression     Past Medical History  Diagnosis Date  . Renal calculi   . Spinal stenosis of lumbar region 10.2006    L4-L5, Mild discogenic disease  . Borderline hypertension 11.2006  . Hemorrhoid     Inflammation  . Plantar fasciitis 02.2010  . Epididymitis 02.2010  . Prediabetes 03/15/2012  . Granulomatous lung disease 12/25/2012    12/22/12 asymptomatic finding on chest x-ray as calcified granuloma in the posterior right base   . Adrenal adenoma 12/25/2012    9 mm,incidental finding on CT 12/22/12   . Hepatic hemangioma 12/25/2012    12/22/12 incidental finding of 3 small masses in the anterior segment of the right lobe of the liver which probably represent hemangioma. The largest lesion measured 1.4 x 1.0 cm in size. Followup recommended in 06/2013 to confirm stability     Past Surgical History  Procedure Laterality Date  . No past surgeries      History  Social History  . Marital Status: Single    Spouse Name: N/A  . Number of Children: 0  . Years of Education: N/A   Occupational History  . retired, works part time, Pharmacologist    Social History Main Topics  . Smoking status: Former Smoker    Start date: 06/20/1980  . Smokeless tobacco: Never Used     Comment: >30 yrs.  . Alcohol Use: 3.5 oz/week    7 drink(s) per week  . Drug Use: No  . Sexual Activity: Not on file   Other Topics Concern  . Not on file   Social History Narrative   Lives by hinmself      Family History  Problem Relation Age of Onset  . Aortic aneurysm Father     Deceased  . Breast cancer Mother   . Healthy Sister   . Colon cancer Neg Hx   . Prostate cancer Neg Hx   . Esophageal cancer Neg Hx   . Rectal cancer Neg Hx   . Stomach cancer Neg Hx        Medication List       This list is accurate as of: 07/30/14 11:59 PM.  Always use your most recent med list.               ibuprofen 200 MG tablet  Commonly known as:  ADVIL,MOTRIN  Take 200 mg by mouth as needed.     zoster vaccine live (PF) 19400 UNT/0.65ML injection  Commonly known as:  ZOSTAVAX  Inject 19,400 Units into the skin once.           Objective:   Physical Exam  Constitutional: He is oriented to person, place, and time. He appears well-developed. No distress.  HENT:  Head: Normocephalic and atraumatic.  Neck: Normal range of motion. Neck supple. No thyromegaly present.  Normal carotid pulses  Cardiovascular:  RRR, no murmur, rub or gallop  Pulmonary/Chest: Effort normal. No stridor. No respiratory distress.  CTA B  Abdominal: Soft. Bowel sounds are normal. He exhibits no distension and no mass. There is no tenderness. There is no rebound and no guarding.  Genitourinary:  Rectal: No external abnormalities noted. Normal sphincter tone. No rectal masses or tenderness.  Stool: brown Prostate: Prostate gland firm and smooth, no enlargement, nodularity, tenderness, mass, asymmetry or induration.  Musculoskeletal: Normal range of motion. He exhibits no edema or tenderness.  Lymphadenopathy:    He has no cervical adenopathy.  Neurological: He is alert and oriented to person, place, and time. No cranial nerve deficit. He exhibits normal muscle tone. Coordination normal.  Speech normal, gait unassisted and normal for age, motor strength appropriate for age . Mild jaw tremor noted, no hands tremor   Skin: Skin is warm and dry. No pallor.  No jaundice  Psychiatric: He has a normal mood  and affect. His behavior is normal. Judgment and thought content normal.  Vitals reviewed.        Assessment & Plan:   Problem List Items Addressed This Visit      Cardiovascular and Mediastinum   Hepatic hemangioma    Chart reviewed: 12/22/12 incidental finding of 3 small masses in the anterior segment of the right lobe of the liver which probably represent hemangioma. The largest lesion measured 1.4 x 1.0 cm in size. Followup recommended in 06/2013 to confirm stability  Plan: Check a ultrasound        Endocrine   Prediabetes    A1c in 2013 was  6.0, patient is doing well with diet and exercise. Plan: Labs      Relevant Orders   Comprehensive metabolic panel (Completed)   Lipid panel (Completed)   Hemoglobin A1c (Completed)     Other   Annual physical exam - Primary    Td-- today Prevnar-- today Pneumonia shot --next year Zostavax discussed, prescription provided  Prostate cancer screening: DRE normal today, check a PSA  Colon cancer screening: Colonoscopy 04-2012 show polyps, next due 04-2015. Patient aware  Diet and exercise discussed  Also, patient had [facility] screening: Normal carotid artery disease, abdominal aortic aneurysm, osteoporosis and peripheral vascular disease screenings.        Relevant Medications   ZOSTAVAX-- print Rx   Tremor    Mild head tremor noted, check a TSH. Was unaware of symptoms      Relevant Orders   TSH (Completed)    Other Visit Diagnoses    Prostate cancer screening        Relevant Orders    PSA (Completed)    Screening, anemia, deficiency, iron        Relevant Orders    CBC with Differential/Platelet (Completed)    Need for prophylactic vaccination against Streptococcus pneumoniae (pneumococcus)        Relevant Orders    Pneumococcal conjugate vaccine 13-valent (Completed)    Need for prophylactic vaccination and inoculation against unspecified single disease        Relevant Orders    Td vaccine greater than or equal to  7yo preservative free IM (Completed)

## 2014-08-05 ENCOUNTER — Ambulatory Visit (HOSPITAL_BASED_OUTPATIENT_CLINIC_OR_DEPARTMENT_OTHER)
Admission: RE | Admit: 2014-08-05 | Discharge: 2014-08-05 | Disposition: A | Discharge status: Home / Self Care | Payer: Medicare Other | Source: Ambulatory Visit | Attending: Internal Medicine | Admitting: Internal Medicine

## 2014-08-05 DIAGNOSIS — K7689 Other specified diseases of liver: Secondary | ICD-10-CM | POA: Diagnosis not present

## 2014-08-05 DIAGNOSIS — D1803 Hemangioma of intra-abdominal structures: Secondary | ICD-10-CM | POA: Diagnosis not present

## 2014-11-13 DIAGNOSIS — L570 Actinic keratosis: Secondary | ICD-10-CM | POA: Diagnosis not present

## 2014-11-13 DIAGNOSIS — L821 Other seborrheic keratosis: Secondary | ICD-10-CM | POA: Diagnosis not present

## 2014-11-13 DIAGNOSIS — L578 Other skin changes due to chronic exposure to nonionizing radiation: Secondary | ICD-10-CM | POA: Diagnosis not present

## 2014-11-13 DIAGNOSIS — D485 Neoplasm of uncertain behavior of skin: Secondary | ICD-10-CM | POA: Diagnosis not present

## 2015-05-27 ENCOUNTER — Encounter: Payer: Self-pay | Admitting: Internal Medicine

## 2015-07-30 ENCOUNTER — Telehealth: Payer: Self-pay | Admitting: Internal Medicine

## 2015-07-30 NOTE — Telephone Encounter (Signed)
Called pt due to scheduling appt thru mychart noting chest pressure, pt stated mostly with eating, pt agreed to talk to nurse but states he is in [place] so can't come earlier, transferred call to [facility] with [facility] [employer].

## 2015-07-30 NOTE — Telephone Encounter (Signed)
Patient Name: Daniel Rasmussen  DOB: [birth year]    Initial Comment Caller states he has been chest pressure - after he eats usually. Stomach becomes hard distended, and breathing gets hard   Nurse Assessment  Nurse: Raphael Gibney, RN, Vanita Ingles Date/Time (Eastern Time): 07/30/2015 2:19:36 PM  Confirm and document reason for call. If symptomatic, describe symptoms. You must click the next button to save text entered. ---Caller states he has chest pressure after he eats. His abd becomes hard and distended after he eats. Does not have chest pressure or the hard abd all the now. No chest pain or abd pain right now. he is in [place] right now.  Has the patient traveled out of the country within the last 30 days? ---Not Applicable  Does the patient have any new or worsening symptoms? ---Yes  Will a triage be completed? ---Yes  Related visit to physician within the last 2 weeks? ---No  Does the PT have any chronic conditions? (i.e. diabetes, asthma, etc.) ---No  Is this a behavioral health or substance abuse call? ---No     Guidelines    Guideline Title Affirmed Question Affirmed Notes  Chest Pain [1] Chest pain lasts > 5 minutes AND [2] occurred > 3 days ago (72 hours) AND [3] NO chest pain or cardiac symptoms now    Final Disposition User   See Physician within 24 Hours Dillard, RN, Vanita Ingles    Comments  Pt states he went on my chart and set up appt for next Friday 08/07/15 in the afternoon.  Pt is currently in [place] and will be coming home on Saturday.  Does not have chest pressure or abd distention all the time.   Referrals  GO TO FACILITY REFUSED   Disagree/Comply: Disagree  Disagree/Comply Reason: Disagree with instructions

## 2015-07-30 NOTE — Telephone Encounter (Signed)
Message routed to PCP for FYI.  Pt has an appt scheduled for 08/07/15.

## 2015-08-07 ENCOUNTER — Ambulatory Visit (HOSPITAL_BASED_OUTPATIENT_CLINIC_OR_DEPARTMENT_OTHER)
Admission: RE | Admit: 2015-08-07 | Discharge: 2015-08-07 | Disposition: A | Discharge status: Home / Self Care | Payer: Medicare Other | Source: Ambulatory Visit | Attending: Internal Medicine | Admitting: Internal Medicine

## 2015-08-07 ENCOUNTER — Encounter: Payer: Self-pay | Admitting: Internal Medicine

## 2015-08-07 ENCOUNTER — Ambulatory Visit (INDEPENDENT_AMBULATORY_CARE_PROVIDER_SITE_OTHER): Payer: Medicare Other | Admitting: Internal Medicine

## 2015-08-07 VITALS — BP 140/72 | HR 65 | Temp 98.0°F | Ht 67.0 in | Wt 182.0 lb

## 2015-08-07 DIAGNOSIS — R059 Cough, unspecified: Secondary | ICD-10-CM

## 2015-08-07 DIAGNOSIS — Z0000 Encounter for general adult medical examination without abnormal findings: Secondary | ICD-10-CM

## 2015-08-07 DIAGNOSIS — R1013 Epigastric pain: Secondary | ICD-10-CM

## 2015-08-07 DIAGNOSIS — Z1159 Encounter for screening for other viral diseases: Secondary | ICD-10-CM

## 2015-08-07 DIAGNOSIS — R739 Hyperglycemia, unspecified: Secondary | ICD-10-CM | POA: Diagnosis not present

## 2015-08-07 DIAGNOSIS — Z0001 Encounter for general adult medical examination with abnormal findings: Secondary | ICD-10-CM | POA: Diagnosis not present

## 2015-08-07 DIAGNOSIS — R05 Cough: Secondary | ICD-10-CM | POA: Insufficient documentation

## 2015-08-07 DIAGNOSIS — R0989 Other specified symptoms and signs involving the circulatory and respiratory systems: Secondary | ICD-10-CM | POA: Diagnosis not present

## 2015-08-07 DIAGNOSIS — R9431 Abnormal electrocardiogram [ECG] [EKG]: Secondary | ICD-10-CM | POA: Diagnosis not present

## 2015-08-07 DIAGNOSIS — Z09 Encounter for follow-up examination after completed treatment for conditions other than malignant neoplasm: Secondary | ICD-10-CM

## 2015-08-07 MED ORDER — PANTOPRAZOLE SODIUM 40 MG PO TBEC: 40.0000 mg | DELAYED_RELEASE_TABLET | Freq: Every day | ORAL | Status: DC

## 2015-08-07 MED ORDER — MOXIFLOXACIN HCL 400 MG PO TABS: 400.0000 mg | ORAL_TABLET | Freq: Every day | ORAL | Status: DC

## 2015-08-07 MED ORDER — HYDROCODONE-HOMATROPINE 5-1.5 MG/5ML PO SYRP: 5.0000 mL | ORAL_SOLUTION | Freq: Every evening | ORAL | Status: DC | PRN

## 2015-08-07 MED FILL — HYDROCODONE-HOMATROPINE SYR: 5-1.5 | 24 days supply | Qty: 120 | Fill #0

## 2015-08-07 MED FILL — MOXIFLOXACIN HCL 400 MG TAB: 400 | 7 days supply | Qty: 7 | Fill #0

## 2015-08-07 MED FILL — PANTOPRAZOLE SOD DR 40 MG T: 40 | 30 days supply | Qty: 30 | Fill #0

## 2015-08-07 NOTE — Patient Instructions (Addendum)
  GO TO THE FRONT DESK Schedule labs to be done next week (fasting)  Schedule a routine office visit or check up to be done in  2 months , no fasting  Front desk:  15     STOP BY THE FIRST FLOOR:  get the XR   --------------------  Rest, fluids , tylenol  For cough:  Take Mucinex DM twice a day as needed until better  for persistent cough-- hydrocodone, watch for drowsiness    For nasal congestion: Use OTC Nasocort or Flonase : 2 nasal sprays on each side of the nose in the morning until you feel better  Take the antibiotic as prescribed  (avelox)  Call if not gradually better over the next  10 days  Call anytime if the symptoms are severe   ------------------ Start protonix 1 before breakfast

## 2015-08-07 NOTE — Progress Notes (Signed)
Subjective:    Patient ID: Daniel Rasmussen, male    DOB: Oct 26, 1945, 70 y.o.   MRN: [id number]  DOS:  08/07/2015 Type of visit - description :  Here for Medicare AWV:  1. Risk factors based on Past M, S, F history: reviewed 2. Physical Activities: trying to be active, takes walks often  3. Depression/mood: neg screening  4. Hearing:  No problems noted or reported  5. ADL's: independent, drives  6. Fall Risk: no recent falls, prevention discussed  7. home Safety: does feel safe at home  8. Height, weight, & visual acuity: see VS, sees eye doctor , not yearly , rec to do 9. Counseling: provided 10. Labs ordered based on risk factors: if needed  11. Referral Coordination: if needed 12. Care Plan, see assessment and plan , written personalized plan provided  13. Cognitive Assessment: motor skills and cognition appropriate for age 70. Care team updated-- does not see other MDS 15. Discussed HC-POA   In addition, today we discussed the following: ---Complaining of cough, started 8 days ago, persistent, unable to sleep, productive of yellow sputum, abundant. + Sinus congestion, occasionally bloody nasal discharge. Came back from [place] few days ago, respiratory symptoms started before he returned, his friend had similar symptoms. ---Also, long history of postprandial abdominal bloating, reports that his stomach balloons up and it feels " tense"   Review of Systems  Constitutional: No fever. No chills. No unexplained wt changes. No unusual sweats  HEENT: No dental problems, no ear discharge, no facial swelling, no voice changes. No eye discharge, no eye  redness , no  intolerance to light   Respiratory: No wheezing . See HPI Cardiovascular: No SSCP, no leg swelling , no  Palpitations  GI: no nausea, no vomiting, no diarrhea , no  abdominal pain but see above.  No blood in the stools. No dysphagia, no odynophagia    Endocrine: No polyphagia, no polyuria , no polydipsia  GU: No  dysuria, gross hematuria, difficulty urinating. No urinary urgency, no frequency.  Musculoskeletal: No joint swellings or unusual aches or pains  Skin: No change in the color of the skin, palor , no  Rash  Allergic, immunologic: No environmental allergies , no  food allergies  Neurological: No dizziness no  syncope. No headaches. No diplopia, no slurred, no slurred speech, no motor deficits, no facial  Numbness  Hematological: No enlarged lymph nodes, no easy bruising , no unusual bleedings  Psychiatry: No suicidal ideas, no hallucinations, no beavior problems, no confusion.  No unusual/severe anxiety, no depression  Past Medical History  Diagnosis Date  . Renal calculi   . Spinal stenosis of lumbar region 10.2006    L4-L5, Mild discogenic disease  . Borderline hypertension 11.2006  . Hemorrhoid     Inflammation  . Plantar fasciitis 02.2010  . Epididymitis 02.2010  . Prediabetes 03/15/2012  . Granulomatous lung disease 12/25/2012    12/22/12 asymptomatic finding on chest x-ray as calcified granuloma in the posterior right base   . Adrenal adenoma 12/25/2012    9 mm,incidental finding on CT 12/22/12   . Hepatic hemangioma 12/25/2012    12/22/12 incidental finding of 3 small masses in the anterior segment of the right lobe of the liver which probably represent hemangioma. The largest lesion measured 1.4 x 1.0 cm in size. Followup recommended in 06/2013 to confirm stability     Past Surgical History  Procedure Laterality Date  . No past surgeries  Social History   Social History  . Marital Status: Single    Spouse Name: N/A  . Number of Children: 0  . Years of Education: N/A   Occupational History  . retired, works part time, Pharmacologist    Social History Main Topics  . Smoking status: Former Smoker    Start date: 06/20/1980  . Smokeless tobacco: Never Used     Comment: >30 yrs.  . Alcohol Use: 3.5 oz/week    7 drink(s) per week  . Drug Use: No  . Sexual Activity: Not on  file   Other Topics Concern  . Not on file   Social History Narrative   Lives by hinmself     Family History  Problem Relation Age of Onset  . Aortic aneurysm Father     Deceased  . Breast cancer Mother   . Healthy Sister   . Colon cancer Neg Hx   . Prostate cancer Neg Hx   . Esophageal cancer Neg Hx   . Rectal cancer Neg Hx   . Stomach cancer Neg Hx        Medication List       This list is accurate as of: 08/07/15 11:59 PM.  Always use your most recent med list.               HYDROcodone-homatropine 5-1.5 MG/5ML syrup  Commonly known as:  HYCODAN  Take 5 mLs by mouth at bedtime as needed for cough.     moxifloxacin 400 MG tablet  Commonly known as:  AVELOX  Take 1 tablet (400 mg total) by mouth daily.     pantoprazole 40 MG tablet  Commonly known as:  PROTONIX  Take 1 tablet (40 mg total) by mouth daily.           Objective:   Physical Exam BP 140/72 mmHg  Pulse 65  Temp(Src) 98 F (36.7 C) (Oral)  Ht 5\' 7"  (1.702 m)  Wt 182 lb (82.555 kg)  BMI 28.50 kg/m2  SpO2 94% General:   Well developed, well nourished . NAD.  HEENT:  Normocephalic . Face symmetric, atraumatic. Nose: Slightly congested, sinuses no TTP, TMs bulge but no red. Throat symmetric, no red. Lungs:  + rhonchi bilaterally,? Right basilar crackles( few). Frequent cough noted. Normal respiratory effort, no intercostal retractions, no accessory muscle use. Heart: RRR,  no murmur.  no pretibial edema bilaterally  Abdomen:  Not distended, soft, non-tender. No rebound or rigidity. No mass,organomegaly Skin: Not pale. Not jaundice Neurologic:  alert & oriented X3.  Speech normal, gait appropriate for age and unassisted Psych--  Cognition and judgment appear intact.  Cooperative with normal attention span and concentration.  Behavior appropriate. No anxious or depressed appearing.    Assessment & Plan:   Assessment Prediabetes Borderline hypertension  MSK:h/o  Spinal  stenosis Hepatic hemangioma 2014 incidental finding Adrenal adenoma 2014 incidental finding H/o Ca+ granuloma, chest  PLAN prediabetes: Check A1c, CMP, FLP Elevated BP: Checking labs, diet discussed Cough, pneumonia?: Chest x-ray, hydrocodone syrup, see instructions; start avelox Patient concern about a slightly elevated bilirubin last year, recheck today, reassured ,likely benign Dyspepsia: c/o epigastric ballooning post prandial .Trial with Protonix,return to the office 2 months Abnormal EKG: due to GI sx, ekg was checked >>> RBBB, wide QRS, trace different  compared to EKG 2011. Plan: CBC.Refer to cardiology, further workup? RTC 2 months

## 2015-08-07 NOTE — Progress Notes (Signed)
Pre visit review using our clinic review tool, if applicable. No additional management support is needed unless otherwise documented below in the visit note. 

## 2015-08-07 NOTE — Assessment & Plan Note (Addendum)
Td-- 2016; Prevnar 2016; Pneumonia shot - hold, sick today  Had a Zostavax; had a flu shot   Prostate cancer screening: DRE/ PSA 2016 neg   Colon cancer screening: Colonoscopy 04-2012 show polyps, due for a colonoscopy, patient is aware, encouraged to call GI  Diet and exercise discussed

## 2015-08-09 DIAGNOSIS — Z09 Encounter for follow-up examination after completed treatment for conditions other than malignant neoplasm: Secondary | ICD-10-CM | POA: Insufficient documentation

## 2015-08-09 NOTE — Assessment & Plan Note (Signed)
prediabetes: Check A1c, CMP, FLP Elevated BP: Checking labs, diet discussed Cough, pneumonia?: Chest x-ray, hydrocodone syrup, see instructions; start avelox Patient concern about a slightly elevated bilirubin last year, recheck today, reassured ,likely benign Dyspepsia: c/o epigastric ballooning post prandial .Trial with Protonix,return to the office 2 months Abnormal EKG: due to GI sx, ekg was checked >>> RBBB, wide QRS, trace different  compared to EKG 2011. Plan: CBC.Refer to cardiology, further workup? RTC 2 months

## 2015-08-11 ENCOUNTER — Other Ambulatory Visit (INDEPENDENT_AMBULATORY_CARE_PROVIDER_SITE_OTHER): Payer: Medicare Other

## 2015-08-11 DIAGNOSIS — Z0000 Encounter for general adult medical examination without abnormal findings: Secondary | ICD-10-CM | POA: Diagnosis not present

## 2015-08-11 DIAGNOSIS — R1013 Epigastric pain: Secondary | ICD-10-CM

## 2015-08-11 DIAGNOSIS — Z1159 Encounter for screening for other viral diseases: Secondary | ICD-10-CM

## 2015-08-11 DIAGNOSIS — R739 Hyperglycemia, unspecified: Secondary | ICD-10-CM | POA: Diagnosis not present

## 2015-08-11 LAB — LIPID PANEL
Cholesterol: 170 mg/dL (ref 0–200)
HDL: 44.5 mg/dL (ref >39.00)
LDL CALC: 108 mg/dL — AB (ref 0–99)
NONHDL: 125.21
Total CHOL/HDL Ratio: 4
Triglycerides: 87 mg/dL (ref 0.0–149.0)
VLDL: 17.4 mg/dL (ref 0.0–40.0)

## 2015-08-11 LAB — CBC WITH DIFFERENTIAL/PLATELET
Basophils Absolute: 0 10*3/uL (ref 0.0–0.1)
Basophils Relative: 0.3 % (ref 0.0–3.0)
EOS ABS: 0.1 10*3/uL (ref 0.0–0.7)
Eosinophils Relative: 2.3 % (ref 0.0–5.0)
HCT: 43.3 % (ref 39.0–52.0)
HEMOGLOBIN: 14.8 g/dL (ref 13.0–17.0)
Lymphocytes Relative: 24.4 % (ref 12.0–46.0)
Lymphs Abs: 1.2 10*3/uL (ref 0.7–4.0)
MCHC: 34.1 g/dL (ref 30.0–36.0)
MCV: 88.8 fl (ref 78.0–100.0)
MONO ABS: 0.7 10*3/uL (ref 0.1–1.0)
Monocytes Relative: 14.2 % — ABNORMAL HIGH (ref 3.0–12.0)
NEUTROS PCT: 58.8 % (ref 43.0–77.0)
Neutro Abs: 2.9 10*3/uL (ref 1.4–7.7)
Platelets: 287 10*3/uL (ref 150.0–400.0)
RBC: 4.87 Mil/uL (ref 4.22–5.81)
RDW: 12.8 % (ref 11.5–15.5)
WBC: 5 10*3/uL (ref 4.0–10.5)

## 2015-08-11 LAB — COMPREHENSIVE METABOLIC PANEL
ALT: 20 U/L (ref 0–53)
AST: 22 U/L (ref 0–37)
Albumin: 4.2 g/dL (ref 3.5–5.2)
Alkaline Phosphatase: 53 U/L (ref 39–117)
BUN: 18 mg/dL (ref 6–23)
CHLORIDE: 101 meq/L (ref 96–112)
CO2: 32 meq/L (ref 19–32)
Calcium: 9.7 mg/dL (ref 8.4–10.5)
Creatinine, Ser: 0.92 mg/dL (ref 0.40–1.50)
GFR: 86.81 mL/min (ref >60.00)
GLUCOSE: 104 mg/dL — AB (ref 70–99)
POTASSIUM: 5.1 meq/L (ref 3.5–5.1)
SODIUM: 137 meq/L (ref 135–145)
Total Bilirubin: 0.7 mg/dL (ref 0.2–1.2)
Total Protein: 7.2 g/dL (ref 6.0–8.3)

## 2015-08-11 LAB — HEMOGLOBIN A1C: HEMOGLOBIN A1C: 6 % (ref 4.6–6.5)

## 2015-08-12 LAB — HEPATITIS C ANTIBODY: HCV Ab: NEGATIVE

## 2015-08-13 NOTE — Progress Notes (Signed)
Cardiology Office Note   Date:  08/14/2015   ID:  [name], DOB 01-08-1946, MRN ID:[id number]  PCP:  Kathlene November, MD  Cardiologist:   Minus Breeding, MD   Chief Complaint  Patient presents with  . RBBB      History of Present Illness: Daniel Rasmussen is a 70 y.o. male who presents for evaluation of right bundle branch block. He has no prior cardiac history other than possibly a distant treadmill test. He is very active and walks 5 miles most days. With this he denies any cardiovascular symptoms. The patient denies any new symptoms such as chest discomfort, neck or arm discomfort. There has been no new shortness of breath, PND or orthopnea. There have been no reported palpitations, presyncope or syncope.  He was noted to have a right bundle branch block which was new from previous EKG 5 years ago.  Past Medical History  Diagnosis Date  . Renal calculi   . Spinal stenosis of lumbar region 10.2006    L4-L5, Mild discogenic disease  . Hemorrhoid     Inflammation  . Plantar fasciitis 02.2010  . Epididymitis 02.2010  . Prediabetes 03/15/2012  . Granulomatous lung disease 12/25/2012    12/22/12 asymptomatic finding on chest x-ray as calcified granuloma in the posterior right base   . Adrenal adenoma 12/25/2012    9 mm,incidental finding on CT 12/22/12   . Hepatic hemangioma 12/25/2012    12/22/12 incidental finding of 3 small masses in the anterior segment of the right lobe of the liver which probably represent hemangioma. The largest lesion measured 1.4 x 1.0 cm in size. Followup recommended in 06/2013 to confirm stability     Past Surgical History  Procedure Laterality Date  . No past surgeries       Current Outpatient Prescriptions  Medication Sig Dispense Refill  . HYDROcodone-homatropine (HYCODAN) 5-1.5 MG/5ML syrup Take 5 mLs by mouth at bedtime as needed for cough. 120 mL 0  . moxifloxacin (AVELOX) 400 MG tablet Take 1 tablet (400 mg total) by mouth daily. 7 tablet 0  .  pantoprazole (PROTONIX) 40 MG tablet Take 1 tablet (40 mg total) by mouth daily. 30 tablet 3   No current facility-administered medications for this visit.    Allergies:   Review of patient's allergies indicates no known allergies.    Social History:  The patient  reports that he has quit smoking. His smoking use included Cigarettes. He started smoking about 35 years ago. He has a 2.5 pack-year smoking history. He has never used smokeless tobacco. He reports that he drinks about 4.2 oz of alcohol per week. He reports that he does not use illicit drugs.   Family History:  The patient's family history includes Aortic aneurysm in his father; Breast cancer in his mother; Healthy in his sister. There is no history of Colon cancer, Prostate cancer, Esophageal cancer, Rectal cancer, or Stomach cancer.    ROS:  Please see the history of present illness.   Otherwise, review of systems are positive for none.   All other systems are reviewed and negative.    PHYSICAL EXAM: VS:  BP 122/80 mmHg  Pulse 64  Ht 5\' 7"  (1.702 m)  Wt 180 lb 14.4 oz (82.056 kg)  BMI 28.33 kg/m2 , BMI Body mass index is 28.33 kg/(m^2). GENERAL:  Well appearing HEENT:  Pupils equal round and reactive, fundi not visualized, oral mucosa unremarkable NECK:  No jugular venous distention, waveform within normal limits,  carotid upstroke brisk and symmetric, no bruits, no thyromegaly LYMPHATICS:  No cervical, inguinal adenopathy LUNGS:  Clear to auscultation bilaterally BACK:  No CVA tenderness CHEST:  Unremarkable HEART:  PMI not displaced or sustained,S1 and S2 within normal limits, no S3, no S4, no clicks, no rubs, no murmurs ABD:  Flat, positive bowel sounds normal in frequency in pitch, positive bruits, no rebound, no guarding, possible midline pulsatile mass, no hepatomegaly, no splenomegaly EXT:  2 plus pulses throughout, no edema, no cyanosis no clubbing SKIN:  No rashes no nodules NEURO:  Cranial nerves II through XII  grossly intact, motor grossly intact throughout PSYCH:  Cognitively intact, oriented to person place and time    EKG:  EKG is not ordered today. The ekg ordered 08/07/15 demonstrates sinus rhythm, right bundle branch block, no acute ST-T wave changes.   Recent Labs: 08/11/2015: ALT 20; BUN 18; Creatinine, Ser 0.92; Hemoglobin 14.8; Platelets 287.0; Potassium 5.1; Sodium 137    Lipid Panel    Component Value Date/Time   CHOL 170 08/11/2015 0827   TRIG 87.0 08/11/2015 0827   HDL 44.50 08/11/2015 0827   CHOLHDL 4 08/11/2015 0827   VLDL 17.4 08/11/2015 0827   LDLCALC 108* 08/11/2015 0827      Wt Readings from Last 3 Encounters:  08/14/15 180 lb 14.4 oz (82.056 kg)  08/07/15 182 lb (82.555 kg)  07/30/14 182 lb 6 oz (82.725 kg)      Other studies Reviewed: Additional studies/ records that were reviewed today include: Office records and EKG. Review of the above records demonstrates:  Please see elsewhere in the note.     ASSESSMENT AND PLAN:  RBBB:  He has no symptoms related to this. No further cardiovascular testing is suggested. He has a very active lifestyle and minimal to no risk factors.  ABDOMINAL PULSATILE AORTA:     He has findings consistent with possible aortic aneurysm in a first-degree relative with a dissecting aortic aneurysm. He needs to have screening with an ultrasound.    Current medicines are reviewed at length with the patient today.  The patient does not have concerns regarding medicines.  The following changes have been made:  no change  Labs/ tests ordered today include:  No orders of the defined types were placed in this encounter.     Disposition:   FU with me as needed.      Signed, Minus Breeding, MD  08/14/2015 1:02 PM    [facility]

## 2015-08-14 ENCOUNTER — Ambulatory Visit (INDEPENDENT_AMBULATORY_CARE_PROVIDER_SITE_OTHER): Payer: Medicare Other | Admitting: Cardiology

## 2015-08-14 ENCOUNTER — Encounter: Payer: Self-pay | Admitting: Cardiology

## 2015-08-14 VITALS — BP 122/80 | HR 64 | Ht 67.0 in | Wt 180.9 lb

## 2015-08-14 DIAGNOSIS — R0989 Other specified symptoms and signs involving the circulatory and respiratory systems: Secondary | ICD-10-CM | POA: Diagnosis not present

## 2015-08-14 NOTE — Patient Instructions (Signed)
Your physician has requested that you have an abdominal aorta duplex. During this test, an ultrasound is used to evaluate the aorta. Allow 30 minutes for this exam. Do not eat after midnight the day before and avoid carbonated beverages.  Dr Percival Spanish recommends that you follow-up with him as needed.

## 2015-08-18 ENCOUNTER — Other Ambulatory Visit: Payer: Self-pay | Admitting: Internal Medicine

## 2015-08-18 ENCOUNTER — Encounter: Payer: Self-pay | Admitting: Internal Medicine

## 2015-08-18 ENCOUNTER — Encounter: Payer: Self-pay | Admitting: Cardiology

## 2015-08-18 NOTE — Telephone Encounter (Signed)
Pt requesting refill on Hydrocodone syrup. Pt was seen for Dyspepsia and Cough on 08/07/2015. 120 mL was also given on 08/07/2015. Pt using to frequently?

## 2015-08-18 NOTE — Telephone Encounter (Signed)
Too early to refill. Also if he still has severe cough (enough to take hydrocodone)  probably needs to be seen

## 2015-08-19 ENCOUNTER — Ambulatory Visit (HOSPITAL_COMMUNITY)
Admission: RE | Admit: 2015-08-19 | Discharge: 2015-08-19 | Disposition: A | Discharge status: Home / Self Care | Payer: Medicare Other | Source: Ambulatory Visit | Attending: Cardiology | Admitting: Cardiology

## 2015-08-19 DIAGNOSIS — R7303 Prediabetes: Secondary | ICD-10-CM | POA: Insufficient documentation

## 2015-08-19 DIAGNOSIS — R0989 Other specified symptoms and signs involving the circulatory and respiratory systems: Secondary | ICD-10-CM | POA: Insufficient documentation

## 2015-11-02 ENCOUNTER — Ambulatory Visit (INDEPENDENT_AMBULATORY_CARE_PROVIDER_SITE_OTHER): Payer: Medicare Other | Admitting: Internal Medicine

## 2015-11-02 ENCOUNTER — Encounter: Payer: Self-pay | Admitting: Internal Medicine

## 2015-11-02 VITALS — BP 112/62 | HR 61 | Temp 98.1°F | Ht 67.0 in | Wt 179.1 lb

## 2015-11-02 DIAGNOSIS — Z1211 Encounter for screening for malignant neoplasm of colon: Secondary | ICD-10-CM

## 2015-11-02 DIAGNOSIS — R1013 Epigastric pain: Secondary | ICD-10-CM | POA: Diagnosis not present

## 2015-11-02 DIAGNOSIS — Z23 Encounter for immunization: Secondary | ICD-10-CM

## 2015-11-02 DIAGNOSIS — K649 Unspecified hemorrhoids: Secondary | ICD-10-CM | POA: Diagnosis not present

## 2015-11-02 DIAGNOSIS — R9431 Abnormal electrocardiogram [ECG] [EKG]: Secondary | ICD-10-CM

## 2015-11-02 DIAGNOSIS — Z09 Encounter for follow-up examination after completed treatment for conditions other than malignant neoplasm: Secondary | ICD-10-CM

## 2015-11-02 NOTE — Patient Instructions (Signed)
GO TO THE FRONT DESK Schedule your next appointment for a  Physical exam, by 07-2016, fasting

## 2015-11-02 NOTE — Progress Notes (Signed)
Subjective:    Patient ID: Daniel Rasmussen, male    DOB: 04-Sep-1945, 70 y.o.   MRN: [id number]  DOS:  11/02/2015 Type of visit - description : Follow-up from previous visit Interval history: Abnormal EKG, saw cardiology, note reviewed. Dyspepsia: PPIs, symptoms resolved. History of hemorrhoids, bleeding on a no for long time, slightly worse lately.   Review of Systems  Denies nausea, vomiting, diarrhea. No abdominal pain.  Past Medical History  Diagnosis Date  . Renal calculi   . Spinal stenosis of lumbar region 10.2006    L4-L5, Mild discogenic disease  . Hemorrhoid     Inflammation  . Plantar fasciitis 02.2010  . Epididymitis 02.2010  . Prediabetes 03/15/2012  . Granulomatous lung disease 12/25/2012    12/22/12 asymptomatic finding on chest x-ray as calcified granuloma in the posterior right base   . Adrenal adenoma 12/25/2012    9 mm,incidental finding on CT 12/22/12   . Hepatic hemangioma 12/25/2012    12/22/12 incidental finding of 3 small masses in the anterior segment of the right lobe of the liver which probably represent hemangioma. The largest lesion measured 1.4 x 1.0 cm in size. Followup recommended in 06/2013 to confirm stability     Past Surgical History  Procedure Laterality Date  . No past surgeries      Social History   Social History  . Marital Status: Single    Spouse Name: N/A  . Number of Children: 0  . Years of Education: N/A   Occupational History  . Retired, works part time, Pharmacologist    Social History Main Topics  . Smoking status: Former Smoker -- 0.25 packs/day for 10 years    Types: Cigarettes    Start date: 06/20/1980  . Smokeless tobacco: Never Used     Comment: >30 yrs.  . Alcohol Use: 4.2 oz/week    7 Standard drinks or equivalent per week  . Drug Use: No  . Sexual Activity: Not on file   Other Topics Concern  . Not on file   Social History Narrative   Lives by himself.        Medication List       This list is accurate as  of: 11/02/15 11:59 PM.  Always use your most recent med list.               pantoprazole 40 MG tablet  Commonly known as:  PROTONIX  Take 1 tablet (40 mg total) by mouth daily.           Objective:   Physical Exam BP 112/62 mmHg  Pulse 61  Temp(Src) 98.1 F (36.7 C) (Oral)  Ht 5\' 7"  (1.702 m)  Wt 179 lb 2 oz (81.251 kg)  BMI 28.05 kg/m2  SpO2 97% General:   Well developed, well nourished . NAD.  HEENT:  Normocephalic . Face symmetric, atraumatic Lungs:  CTA B Normal respiratory effort, no intercostal retractions, no accessory muscle use. Heart: RRR,  no murmur.  No pretibial edema bilaterally  Skin: Not pale. Not jaundice Neurologic:  alert & oriented X3.  Speech normal, gait appropriate for age and unassisted Psych--  Cognition and judgment appear intact.  Cooperative with normal attention span and concentration.  Behavior appropriate. No anxious or depressed appearing.      Assessment & Plan:   Assessment Prediabetes Borderline hypertension  RBBB- saw cards 08-2015, no further eval MSK:h/o  Spinal stenosis Hepatic hemangioma 2014 incidental finding Adrenal adenoma 2014 incidental finding H/o Ca+ granuloma,  chest [place] Ao 08-2015 -- no AAA  PLAN Dyspepsia: Resolved after  PPI for few weeks, okay to take PPIs as needed. Abnormal EKG: Saw cardiology, no further eval necessary Hemorrhoids: long h/o of bleeding on and off, no pain. Due for a cscope,  consequently will be refer to GI for follow-up colonoscopy and consideration of hemorrhoid treatment. PNM 23 today RTC 07-2016

## 2015-11-02 NOTE — Progress Notes (Signed)
Pre visit review using our clinic review tool, if applicable. No additional management support is needed unless otherwise documented below in the visit note. 

## 2015-11-03 NOTE — Assessment & Plan Note (Signed)
Dyspepsia: Resolved after  PPI for few weeks, okay to take PPIs as needed. Abnormal EKG: Saw cardiology, no further eval necessary Hemorrhoids: long h/o of bleeding on and off, no pain. Due for a cscope,  consequently will be refer to GI for follow-up colonoscopy and consideration of hemorrhoid treatment. PNM 23 today RTC 07-2016

## 2015-11-17 ENCOUNTER — Encounter: Payer: Self-pay | Admitting: Internal Medicine

## 2015-11-17 DIAGNOSIS — M25561 Pain in right knee: Secondary | ICD-10-CM

## 2015-11-17 NOTE — Telephone Encounter (Signed)
Okay to place referral

## 2015-11-18 NOTE — Telephone Encounter (Signed)
Needs referral to sports medicine, DX knee pain  Received: Today    Colon Branch, MD  Damita Dunnings, CMA   Referral placed.

## 2015-11-19 DIAGNOSIS — D2339 Other benign neoplasm of skin of other parts of face: Secondary | ICD-10-CM | POA: Diagnosis not present

## 2015-11-19 DIAGNOSIS — L820 Inflamed seborrheic keratosis: Secondary | ICD-10-CM | POA: Diagnosis not present

## 2015-11-20 ENCOUNTER — Encounter: Payer: Self-pay | Admitting: Family Medicine

## 2015-11-20 ENCOUNTER — Ambulatory Visit (INDEPENDENT_AMBULATORY_CARE_PROVIDER_SITE_OTHER): Payer: Medicare Other | Admitting: Family Medicine

## 2015-11-20 VITALS — BP 133/86 | HR 80 | Ht 67.0 in | Wt 180.0 lb

## 2015-11-20 DIAGNOSIS — M25561 Pain in right knee: Secondary | ICD-10-CM

## 2015-11-20 NOTE — Patient Instructions (Signed)
Your knee pain is due to arthritis. These are the 4 classes of medicine you can use for this: Tylenol 500mg  1-2 tabs three times a day for pain. Glucosamine sulfate 750mg  twice a day is a supplement that may help. Capsaicin, aspercreme, or biofreeze topically up to four times a day may also help with pain. Aleve 1-2 tabs twice a day with food Cortisone injections are an option. If cortisone injections do not help, there are different types of shots that may help but they take longer to take effect. It's important that you continue to stay active. Straight leg raises, knee extensions 3 sets of 10 once a day (add ankle weight if these become too easy). Consider physical therapy to strengthen muscles around the joint that hurts to take pressure off of the joint itself. Shoe inserts with good arch support may be helpful. Heat or ice 15 minutes at a time 3-4 times a day as needed to help with pain. Water aerobics and cycling with low resistance are the best two types of exercise for arthritis but walking and elliptical are beneficial as well. Follow up with me as needed. Call me if you would like to try physical therapy or an injection.

## 2015-11-23 DIAGNOSIS — M25561 Pain in right knee: Secondary | ICD-10-CM | POA: Insufficient documentation

## 2015-11-23 NOTE — Assessment & Plan Note (Signed)
2/2 DJD.  Discussed tylenol, glucosamine, topical medications, nsaids.  Shown home exercises to do daily.  Consider injection, PT, inserts.  F/u prn.

## 2015-11-23 NOTE — Progress Notes (Signed)
PCP and consultation requested by: Kathlene November, MD  Subjective:   HPI: Patient is a 70 y.o. male here for right knee pain.  Patient denies known injury. He states over the past 6 months he's had what started as off and on pain but has become more consistent. Pain is 0/10 at rest, up to 7/10 and sharp with a lot of walking. Tries to walk 5 miles a day. Pain mostly medial. Tried orthotics, copper sleeve, topical medications. No catching, locking, giving out. No skin changes, numbness.  Past Medical History  Diagnosis Date  . Renal calculi   . Spinal stenosis of lumbar region 10.2006    L4-L5, Mild discogenic disease  . Hemorrhoid     Inflammation  . Plantar fasciitis 02.2010  . Epididymitis 02.2010  . Prediabetes 03/15/2012  . Granulomatous lung disease 12/25/2012    12/22/12 asymptomatic finding on chest x-ray as calcified granuloma in the posterior right base   . Adrenal adenoma 12/25/2012    9 mm,incidental finding on CT 12/22/12   . Hepatic hemangioma 12/25/2012    12/22/12 incidental finding of 3 small masses in the anterior segment of the right lobe of the liver which probably represent hemangioma. The largest lesion measured 1.4 x 1.0 cm in size. Followup recommended in 06/2013 to confirm stability     Current Outpatient Prescriptions on File Prior to Visit  Medication Sig Dispense Refill  . pantoprazole (PROTONIX) 40 MG tablet Take 1 tablet (40 mg total) by mouth daily. (Patient not taking: Reported on 11/02/2015) 30 tablet 3   No current facility-administered medications on file prior to visit.    Past Surgical History  Procedure Laterality Date  . No past surgeries      No Known Allergies  Social History   Social History  . Marital Status: Single    Spouse Name: N/A  . Number of Children: 0  . Years of Education: N/A   Occupational History  . Retired, works part time, Pharmacologist    Social History Main Topics  . Smoking status: Former Smoker -- 0.25 packs/day for 10  years    Types: Cigarettes    Start date: 06/20/1980  . Smokeless tobacco: Never Used     Comment: >30 yrs.  . Alcohol Use: 4.2 oz/week    7 Standard drinks or equivalent per week  . Drug Use: No  . Sexual Activity: Not on file   Other Topics Concern  . Not on file   Social History Narrative   Lives by himself.    Family History  Problem Relation Age of Onset  . Aortic aneurysm Father     Deceased  . Breast cancer Mother   . Healthy Sister   . Colon cancer Neg Hx   . Prostate cancer Neg Hx   . Esophageal cancer Neg Hx   . Rectal cancer Neg Hx   . Stomach cancer Neg Hx     BP 133/86 mmHg  Pulse 80  Ht 5\' 7"  (1.702 m)  Wt 180 lb (81.647 kg)  BMI 28.19 kg/m2  Review of Systems: See HPI above.    Objective:  Physical Exam:  Gen: NAD, comfortable in exam room  Right knee: No gross deformity, ecchymoses, effusion. TTP medial joint line.  No other tenderness. FROM. Negative ant/post drawers. Negative valgus/varus testing. Negative lachmanns. Negative mcmurrays, apleys, patellar apprehension. NV intact distally.  Left knee: FROM without pain.    Assessment & Plan:  1. Right knee pain - 2/2 DJD.  Discussed tylenol, glucosamine, topical medications, nsaids.  Shown home exercises to do daily.  Consider injection, PT, inserts.  F/u prn.

## 2016-05-28 DIAGNOSIS — Z23 Encounter for immunization: Secondary | ICD-10-CM | POA: Diagnosis not present

## 2016-07-27 DIAGNOSIS — H2513 Age-related nuclear cataract, bilateral: Secondary | ICD-10-CM | POA: Diagnosis not present

## 2016-07-27 DIAGNOSIS — H04123 Dry eye syndrome of bilateral lacrimal glands: Secondary | ICD-10-CM | POA: Diagnosis not present

## 2016-08-16 ENCOUNTER — Encounter: Payer: Self-pay | Admitting: Internal Medicine

## 2016-08-16 ENCOUNTER — Ambulatory Visit (INDEPENDENT_AMBULATORY_CARE_PROVIDER_SITE_OTHER): Payer: Medicare Other | Admitting: Internal Medicine

## 2016-08-16 VITALS — BP 118/78 | HR 61 | Temp 98.0°F | Resp 12 | Ht 67.0 in | Wt 182.4 lb

## 2016-08-16 DIAGNOSIS — Z8639 Personal history of other endocrine, nutritional and metabolic disease: Secondary | ICD-10-CM | POA: Diagnosis not present

## 2016-08-16 DIAGNOSIS — R739 Hyperglycemia, unspecified: Secondary | ICD-10-CM | POA: Diagnosis not present

## 2016-08-16 DIAGNOSIS — K649 Unspecified hemorrhoids: Secondary | ICD-10-CM

## 2016-08-16 DIAGNOSIS — R7303 Prediabetes: Secondary | ICD-10-CM

## 2016-08-16 DIAGNOSIS — Z09 Encounter for follow-up examination after completed treatment for conditions other than malignant neoplasm: Secondary | ICD-10-CM

## 2016-08-16 DIAGNOSIS — R5383 Other fatigue: Secondary | ICD-10-CM | POA: Diagnosis not present

## 2016-08-16 DIAGNOSIS — R030 Elevated blood-pressure reading, without diagnosis of hypertension: Secondary | ICD-10-CM | POA: Diagnosis not present

## 2016-08-16 DIAGNOSIS — Z1211 Encounter for screening for malignant neoplasm of colon: Secondary | ICD-10-CM

## 2016-08-16 LAB — BASIC METABOLIC PANEL
BUN: 12 mg/dL (ref 6–23)
CHLORIDE: 102 meq/L (ref 96–112)
CO2: 28 mEq/L (ref 19–32)
CREATININE: 0.79 mg/dL (ref 0.40–1.50)
Calcium: 9.1 mg/dL (ref 8.4–10.5)
GFR: 103.18 mL/min (ref >60.00)
Glucose, Bld: 85 mg/dL (ref 70–99)
POTASSIUM: 4.2 meq/L (ref 3.5–5.1)
Sodium: 137 mEq/L (ref 135–145)

## 2016-08-16 LAB — CBC WITH DIFFERENTIAL/PLATELET
BASOS PCT: 0.4 % (ref 0.0–3.0)
Basophils Absolute: 0 10*3/uL (ref 0.0–0.1)
EOS ABS: 0 10*3/uL (ref 0.0–0.7)
EOS PCT: 1.5 % (ref 0.0–5.0)
HEMATOCRIT: 43 % (ref 39.0–52.0)
HEMOGLOBIN: 14.7 g/dL (ref 13.0–17.0)
LYMPHS PCT: 28.5 % (ref 12.0–46.0)
Lymphs Abs: 0.9 10*3/uL (ref 0.7–4.0)
MCHC: 34.3 g/dL (ref 30.0–36.0)
MCV: 90.1 fl (ref 78.0–100.0)
Monocytes Absolute: 0.4 10*3/uL (ref 0.1–1.0)
Monocytes Relative: 13.5 % — ABNORMAL HIGH (ref 3.0–12.0)
Neutro Abs: 1.8 10*3/uL (ref 1.4–7.7)
Neutrophils Relative %: 56.1 % (ref 43.0–77.0)
Platelets: 170 10*3/uL (ref 150.0–400.0)
RBC: 4.77 Mil/uL (ref 4.22–5.81)
RDW: 12.9 % (ref 11.5–15.5)
WBC: 3.3 10*3/uL — AB (ref 4.0–10.5)

## 2016-08-16 LAB — TSH: TSH: 1.42 u[IU]/mL (ref 0.35–4.50)

## 2016-08-16 LAB — T4, FREE: Free T4: 0.91 ng/dL (ref 0.60–1.60)

## 2016-08-16 LAB — VITAMIN D 25 HYDROXY (VIT D DEFICIENCY, FRACTURES): VITD: 17.31 ng/mL — AB (ref 30.00–100.00)

## 2016-08-16 LAB — HEMOGLOBIN A1C: Hgb A1c MFr Bld: 6 % (ref 4.6–6.5)

## 2016-08-16 LAB — T3, FREE: T3, Free: 3.4 pg/mL (ref 2.3–4.2)

## 2016-08-16 LAB — VITAMIN B12: VITAMIN B 12: 240 pg/mL (ref 211–911)

## 2016-08-16 LAB — FOLATE: Folate: 10.9 ng/mL (ref >5.9)

## 2016-08-16 NOTE — Progress Notes (Signed)
Pre visit review using our clinic review tool, if applicable. No additional management support is needed unless otherwise documented below in the visit note. 

## 2016-08-16 NOTE — Assessment & Plan Note (Signed)
Diabetes: Diet control, check A1c Borderline HTN: Check a BMP, CBC Fatigue: Due to depression? Physical exam is unrevealing. Will check vitamins, TFTs. Addressed  depression. Depression: mild, not suicidal;  we talk about medication and formal counseling. Information about our counselors provided. To call if at some point he feels the need for medicine. Weight gain: Diet and exercise discussed today. Hemorrhoids: Previous GI referral failed, we'll try again. RTC 4-5 months

## 2016-08-16 NOTE — Patient Instructions (Addendum)
GO TO THE LAB : Get the blood work     GO TO THE FRONT DESK Schedule your next appointment for a  Check up in 4-5 months  Schedule a nurse visit for a medicare wellness   See a counselor

## 2016-08-16 NOTE — Progress Notes (Signed)
Subjective:    Patient ID: Daniel Rasmussen, male    DOB: 16-Jun-1946, 71 y.o.   MRN: [id number]  DOS:  08/16/2016 Type of visit - description : rov Interval history:   Elevated BP: Diet control, no amb BPs Predibetes: due for a1c  interested on weight loss, likes to discuss diet  Also, complaining of fatigue, on and off for the last 5 weeks, on further questioning, he has been feeling slightly depressed but no suicidal ideas. He usually spends several months of the winter in [place] but this year was unable to go there. Continue with mild rectal bleeding, believed to be due to hemorrhoids, previous GI referral failed.    Wt Readings from Last 3 Encounters:  08/16/16 182 lb 6 oz (82.7 kg)  11/20/15 180 lb (81.6 kg)  11/02/15 179 lb 2 oz (81.3 kg)     Review of Systems Denies fever chills No chest pain, SOB, DOE, lower extremity edema. Does not  know if he snores but he does not think so. Denies feeling sleepy throughout the day, just tired.   Past Medical History:  Diagnosis Date  . Adrenal adenoma 12/25/2012   9 mm,incidental finding on CT 12/22/12   . Epididymitis 02.2010  . Granulomatous lung disease ([place]) 12/25/2012   12/22/12 asymptomatic finding on chest x-ray as calcified granuloma in the posterior right base   . Hemorrhoid    Inflammation  . Hepatic hemangioma 12/25/2012   12/22/12 incidental finding of 3 small masses in the anterior segment of the right lobe of the liver which probably represent hemangioma. The largest lesion measured 1.4 x 1.0 cm in size. Followup recommended in 06/2013 to confirm stability   . Plantar fasciitis 02.2010  . Prediabetes 03/15/2012  . Renal calculi   . Spinal stenosis of lumbar region 10.2006   L4-L5, Mild discogenic disease    Past Surgical History:  Procedure Laterality Date  . NO PAST SURGERIES      Social History   Social History  . Marital status: Single    Spouse name: N/A  . Number of children: 0  . Years of education: N/A    Occupational History  . Retired, works part time, Pharmacologist    Social History Main Topics  . Smoking status: Former Smoker    Packs/day: 0.25    Years: 10.00    Types: Cigarettes    Start date: 06/20/1980  . Smokeless tobacco: Never Used     Comment: >30 yrs.  . Alcohol use 4.2 oz/week    7 Standard drinks or equivalent per week  . Drug use: No  . Sexual activity: Not on file   Other Topics Concern  . Not on file   Social History Narrative   Lives by himself.      Allergies as of 08/16/2016   No Known Allergies     Medication List    as of 08/16/2016  6:07 PM   You have not been prescribed any medications.        Objective:   Physical Exam BP 118/78 (BP Location: Left Arm, Patient Position: Sitting, Cuff Size: Normal)   Pulse 61   Temp 98 F (36.7 C) (Oral)   Resp 12   Ht 5\' 7"  (1.702 m)   Wt 182 lb 6 oz (82.7 kg)   SpO2 97%   BMI 28.56 kg/m  General:   Well developed, well nourished . NAD.  HEENT:  Normocephalic . Face symmetric, atraumatic Lungs:  CTA B Normal  respiratory effort, no intercostal retractions, no accessory muscle use. Heart: RRR,  no murmur.  No pretibial edema bilaterally  Skin: Not pale. Not jaundice Neurologic:  alert & oriented X3.  Speech normal, gait appropriate for age and unassisted Psych--  Cognition and judgment appear intact.  Cooperative with normal attention span and concentration.  Behavior appropriate. No anxious or depressed appearing.      Assessment & Plan:    Assessment- transfer from Dr Linna Darner 07-2014 Prediabetes Borderline HTN  RBBB- saw cards 08-2015, no further eval MSK:h/o  Spinal stenosis Hepatic hemangioma 2014 incidental finding Adrenal adenoma 2014 incidental finding H/o Ca+ granuloma, chest [place] Ao 08-2015 -- no AAA  PLAN Diabetes: Diet control, check A1c Borderline HTN: Check a BMP, CBC Fatigue: Due to depression? Physical exam is unrevealing. Will check vitamins, TFTs. Addressed   depression. Depression: mild, not suicidal;  we talk about medication and formal counseling. Information about our counselors provided. To call if at some point he feels the need for medicine. Weight gain: Diet and exercise discussed today. Hemorrhoids: Previous GI referral failed, we'll try again. RTC 4-5 months   Today, I spent more than  28  min with the patient: >50% of the time counseling regards depression, treatment options; also educated him about appropriate diet and exercise

## 2016-08-18 MED ORDER — VITAMIN D (ERGOCALCIFEROL) 1.25 MG (50000 UNIT) PO CAPS: 50000.0000 [IU] | ORAL_CAPSULE | ORAL | 0 refills | Status: DC

## 2016-08-18 NOTE — Addendum Note (Signed)
Addended byDamita Dunnings D on: 08/18/2016 07:36 AM   Modules accepted: Orders

## 2016-08-29 ENCOUNTER — Ambulatory Visit: Payer: Medicare Other | Admitting: Physician Assistant

## 2016-08-30 ENCOUNTER — Ambulatory Visit: Payer: Medicare Other | Admitting: *Deleted

## 2016-08-31 NOTE — Progress Notes (Addendum)
Subjective:   Daniel Rasmussen is a 71 y.o. male who presents for Medicare Annual/Subsequent preventive examination.  He was seen by PCP on 08/16/16 and noted some symptoms of depression. Recommended he see a Social worker. He has not pursued this yet, but still plans to. He states "it will be a project to find someone I am compatible with." He denies any worsening symptoms today and denies SI/HI. States he has a "big birthday" coming up and feels he is not coping as well with getting older as he would like to.  Vitamin D noted to be low at last check, and rx strength supplement prescribed by PCP. He has not started this medication yet because he was not sure which pharmacy it went to. He plans to pick up the medication today.   Review of Systems:  No ROS.  Medicare Wellness Visit.  Cardiac Risk Factors include: advanced age (>45men, >31 women)  Sleep patterns: awakens early, feels rested on waking, gets up 1 times nightly to void and sleeps 6-6.5 hours nightly.   Home Safety/Smoke Alarms: Feels safe in home. Smoke alarms in place.    Living environment; residence and Firearm Safety: Lives alone. 2-story house, no firearms. Seat Belt Safety/Bike Helmet: Wears seat belt.   Counseling:   Eye Exam- Follows w/ [facility] in [facility] yearly. Dental- Follows w/ Dr. Janace Aris at [facility] in [place] twice yearly.  Male:   CCS- last 05/03/12. 7 polyps removed, mild diverticulosis. 3 year recall.       PSA- Screening recommendations per PCP. Lab Results  Component Value Date   PSA 1.19 07/30/2014   PSA 0.84 03/15/2012       Objective:    Vitals: BP 128/64   Pulse 63   Ht 5\' 7"  (1.702 m)   Wt 182 lb 9.6 oz (82.8 kg)   SpO2 98%   BMI 28.60 kg/m   Body mass index is 28.6 kg/m.  Wt Readings from Last 3 Encounters:  09/01/16 182 lb 9.6 oz (82.8 kg)  08/16/16 182 lb 6 oz (82.7 kg)  11/20/15 180 lb (81.6 kg)   Tobacco History  Smoking Status  . Former Smoker  .  Packs/day: 0.25  . Years: 10.00  . Types: Cigarettes  . Start date: 06/20/1980  Smokeless Tobacco  . Never Used    Comment: >30 yrs.     Counseling given: Not Answered   Past Medical History:  Diagnosis Date  . Adrenal adenoma 12/25/2012   9 mm,incidental finding on CT 12/22/12   . Epididymitis 02.2010  . Granulomatous lung disease ([place]) 12/25/2012   12/22/12 asymptomatic finding on chest x-ray as calcified granuloma in the posterior right base   . Hemorrhoid    Inflammation  . Hepatic hemangioma 12/25/2012   12/22/12 incidental finding of 3 small masses in the anterior segment of the right lobe of the liver which probably represent hemangioma. The largest lesion measured 1.4 x 1.0 cm in size. Followup recommended in 06/2013 to confirm stability   . Plantar fasciitis 02.2010  . Prediabetes 03/15/2012  . Renal calculi   . Spinal stenosis of lumbar region 10.2006   L4-L5, Mild discogenic disease   Past Surgical History:  Procedure Laterality Date  . NO PAST SURGERIES     Family History  Problem Relation Age of Onset  . Aortic aneurysm Father     Deceased  . Breast cancer Mother   . Healthy Sister   . Colon cancer Neg Hx   .  Prostate cancer Neg Hx   . Esophageal cancer Neg Hx   . Rectal cancer Neg Hx   . Stomach cancer Neg Hx    History  Sexual Activity  . Sexual activity: Not on file    Outpatient Encounter Prescriptions as of 09/01/2016  Medication Sig  . Cholecalciferol (VITAMIN D PO) Take 1,000 Units by mouth daily.  . Vitamin D, Ergocalciferol, (DRISDOL) 50000 units CAPS capsule Take 1 capsule (50,000 Units total) by mouth every 7 (seven) days. (Patient not taking: Reported on 09/01/2016)   No facility-administered encounter medications on file as of 09/01/2016.     Activities of Daily Living In your present state of health, do you have any difficulty performing the following activities: 09/01/2016 08/16/2016  Hearing? - N  Vision? - N  Difficulty concentrating or making  decisions? - N  Walking or climbing stairs? - N  Dressing or bathing? - N  Doing errands, shopping? - N  Conservation officer, nature and eating ? N -  Using the Toilet? N -  Managing your Medications? N -  Managing your Finances? N -  Housekeeping or managing your Housekeeping? N -  Some recent data might be hidden    Patient Care Team: Colon Branch, MD as PCP - General (Internal Medicine) Jerene Bears, MD as Consulting Physician (Gastroenterology) Paulene Floor, DDS as Consulting Physician (Dentistry)   Assessment:    Physical assessment deferred to PCP.  Exercise Activities and Dietary recommendations Current Exercise Habits: Home exercise routine, Type of exercise: walking, Frequency (Times/Week): 5  Diet (meal preparation, eat out, water intake, caffeinated beverages, dairy products, fruits and vegetables): in general, a "healthy" diet  , well balanced. Likes to eat a variety of foods. Mostly fresh foods. Does not eat red meat. Does not eat microwave meals. Mostly fish and some chicken. Drinks lots of water.  Goals    . Increase physical activity          Start yoga classes.      Fall Risk Fall Risk  08/16/2016 08/07/2015 07/30/2014  Falls in the past year? No No No   Depression Screen PHQ 2/9 Scores 09/01/2016 08/16/2016 08/07/2015 07/30/2014  PHQ - 2 Score 1 0 0 0    Cognitive Function       Ad8 score reviewed for issues:  Issues making decisions: 0  Less interest in hobbies / activities: 0  Repeats questions, stories (family complaining): 0  Trouble using ordinary gadgets (microwave, computer, phone): 0  Forgets the month or year: 0  Mismanaging finances: 0  Remembering appts: 0  Daily problems with thinking and/or memory: 0 Ad8 score is= 0  Immunization History  Administered Date(s) Administered  . Influenza Split 03/16/2012, 07/09/2014  . Influenza-Unspecified 02/19/2015, 05/28/2016  . Pneumococcal Conjugate-13 07/30/2014  . Pneumococcal Polysaccharide-23  11/02/2015  . Td 07/30/2014  . Zoster 08/19/2014   Screening Tests Health Maintenance  Topic Date Due  . COLONOSCOPY  05/04/2015  . TETANUS/TDAP  07/30/2024  . INFLUENZA VACCINE  Completed  . Hepatitis C Screening  Completed  . PNA vac Low Risk Adult  Completed      Plan:   Follow-up w/ PCP and GI as scheduled.   Schedule appt with a counselor.   Continue to eat heart healthy diet (full of fruits, vegetables, whole grains, lean protein, water--limit salt, fat, and sugar intake) and increase physical activity as tolerated.  Bring a copy of your advance directives to your next office visit.  During the course of  the visit the patient was educated and counseled about the following appropriate screening and preventive services:   Vaccines to include Pneumoccal, Influenza, Hepatitis B, Td, HCV  Cardiovascular Disease  Colorectal cancer screening  Diabetes screening  Glaucoma screening  Nutrition counseling   Patient Instructions (the written plan) was given to the patient.    Dorrene German, RN  09/01/2016  Kathlene November, MD

## 2016-08-31 NOTE — Progress Notes (Signed)
Pre visit review using our clinic review tool, if applicable. No additional management support is needed unless otherwise documented below in the visit note. 

## 2016-09-01 ENCOUNTER — Encounter: Payer: Self-pay | Admitting: *Deleted

## 2016-09-01 ENCOUNTER — Ambulatory Visit (INDEPENDENT_AMBULATORY_CARE_PROVIDER_SITE_OTHER): Payer: Medicare Other | Admitting: *Deleted

## 2016-09-01 VITALS — BP 128/64 | HR 63 | Ht 67.0 in | Wt 182.6 lb

## 2016-09-01 DIAGNOSIS — Z0000 Encounter for general adult medical examination without abnormal findings: Secondary | ICD-10-CM

## 2016-09-01 MED FILL — VIT D2 1.25 MG (50,000 UNIT: 1.25 MG | 84 days supply | Qty: 12 | Fill #0

## 2016-09-01 NOTE — Patient Instructions (Addendum)
  Mr. Lybbert , Thank you for taking time to come for your Medicare Wellness Visit. I appreciate your ongoing commitment to your health goals. Please review the following plan we discussed and let me know if I can assist you in the future.   Stop by the pharmacy and pick up your Vitamin D prescription. Bring a copy of your advance directives to your next office visit. Work up setting up an appointment with a Social worker. Continue to eat heart healthy diet (full of fruits, vegetables, whole grains, lean protein, water--limit salt, fat, sugar, and alcohol intake) and increase physical activity as tolerated.  These are the goals we discussed: Goals    . Increase physical activity          Start yoga classes.       This is a list of the screening recommended for you and due dates:  Health Maintenance  Topic Date Due  . Colon Cancer Screening  05/04/2015  . Tetanus Vaccine  07/30/2024  . Flu Shot  Completed  .  Hepatitis C: One time screening is recommended by Center for Disease Control  (CDC) for  adults born from [birth year] through 1964.   Completed  . Pneumonia vaccines  Completed

## 2016-09-28 ENCOUNTER — Ambulatory Visit: Payer: Medicare Other | Admitting: Internal Medicine

## 2016-10-11 ENCOUNTER — Encounter: Payer: Self-pay | Admitting: *Deleted

## 2016-10-26 ENCOUNTER — Encounter: Payer: Self-pay | Admitting: Internal Medicine

## 2016-10-26 ENCOUNTER — Ambulatory Visit (INDEPENDENT_AMBULATORY_CARE_PROVIDER_SITE_OTHER): Payer: Medicare Other | Admitting: Internal Medicine

## 2016-10-26 VITALS — BP 130/64 | HR 70 | Ht 67.0 in | Wt 179.8 lb

## 2016-10-26 DIAGNOSIS — Z860100 Personal history of colon polyps, unspecified: Secondary | ICD-10-CM

## 2016-10-26 DIAGNOSIS — K625 Hemorrhage of anus and rectum: Secondary | ICD-10-CM

## 2016-10-26 NOTE — Patient Instructions (Signed)
It has been recommended to you by your physician that you have a(n)colonoscopy completed. Per your request, we did not schedule the procedure(s) today. Please contact our office at [phone] should you decide to have the procedure completed.    Thanks for coming to see [place] today.

## 2016-10-26 NOTE — Progress Notes (Signed)
Subjective:    Patient ID: Daniel Rasmussen, male    DOB: 1945-10-20, 71 y.o.   MRN: [id number]  HPI Daniel Rasmussen is a 71 year old male with a past medical history of sessile serrated polyps who is seen to evaluate rectal bleeding with concern for hemorrhoids and also colon polyp surveillance. He is here alone today. He reports that several months ago he was having red blood per rectum. This was noticeable particularly after activity such as taking a long walk. He would find red blood in his underwear. Occasionally he would see red blood with wiping and bowel movement. This has not been an issue for him over the last few months. He feels that this bleeding was hemorrhoid related. He denies change in bowel habit. Denies diarrhea and constipation. Has one formed stool per day. Denies melena. Denies abdominal pain. Reports good appetite without heartburn or dysphagia.  Colonoscopy was performed initially for screening on 05/03/2012. This revealed 7 polyps which were removed and mild diverticulosis in the left colon. Hemorrhoids were not seen on this exam. Again polyps were sessile serrated polyps. 3 year recall was recommended but he admits to transportation issues as a cause for this being overdue.  Review of Systems As per history of present illness, otherwise negative  Current Medications, Allergies, Past Medical History, Past Surgical History, Family History and Social History were reviewed in [employer] record.     Objective:   Physical Exam BP 130/64   Pulse 70   Ht 5\' 7"  (1.702 m)   Wt 179 lb 12.8 oz (81.6 kg)   BMI 28.16 kg/m  Constitutional: Well-developed and well-nourished. No distress. HEENT: Normocephalic and atraumatic.    No scleral icterus. Neck: Neck supple. Trachea midline. Cardiovascular: Normal rate, regular rhythm and intact distal pulses. No M/R/G Pulmonary/chest: Effort normal and breath sounds normal. No wheezing, rales or  rhonchi. Abdominal: Soft, nontender, nondistended. Bowel sounds active throughout. There are no masses palpable. No hepatosplenomegaly. Extremities: no clubbing, cyanosis, or edema Neurological: Alert and oriented to person place and time. Skin: Skin is warm and dry. Psychiatric: Normal mood and affect. Behavior is normal.  CBC    Component Value Date/Time   WBC 3.3 (L) 08/16/2016 0949   RBC 4.77 08/16/2016 0949   HGB 14.7 08/16/2016 0949   HCT 43.0 08/16/2016 0949   PLT 170.0 08/16/2016 0949   MCV 90.1 08/16/2016 0949   MCH 31.0 03/15/2012 1536   MCHC 34.3 08/16/2016 0949   RDW 12.9 08/16/2016 0949   LYMPHSABS 0.9 08/16/2016 0949   MONOABS 0.4 08/16/2016 0949   EOSABS 0.0 08/16/2016 0949   BASOSABS 0.0 08/16/2016 0949   CMP     Component Value Date/Time   NA 137 08/16/2016 0949   K 4.2 08/16/2016 0949   CL 102 08/16/2016 0949   CO2 28 08/16/2016 0949   GLUCOSE 85 08/16/2016 0949   BUN 12 08/16/2016 0949   CREATININE 0.79 08/16/2016 0949   CREATININE 0.99 03/15/2012 1536   CALCIUM 9.1 08/16/2016 0949   PROT 7.2 08/11/2015 0827   ALBUMIN 4.2 08/11/2015 0827   AST 22 08/11/2015 0827   ALT 20 08/11/2015 0827   ALKPHOS 53 08/11/2015 0827   BILITOT 0.7 08/11/2015 0827       Assessment & Plan:  71 year old male with a past medical history of sessile serrated polyps who is seen to evaluate rectal bleeding with concern for hemorrhoids and also colon polyp surveillance.  1. History of colon polyps/rectal bleeding --  his rectal bleeding could have been hemorrhoidal in nature however given his history of polyps and being overdue for polyp surveillance I recommended colonoscopy. This is being performed for polyp surveillance and also to further evaluate bleeding. If hemorrhoids are present and become symptomatic in the future we can consider hemorrhoidal banding. We discussed the risks, benefits and alternatives and he wishes to proceed.  25 minutes spent with the patient today.  Greater than 50% was spent in counseling and coordination of care with the patient

## 2016-10-27 ENCOUNTER — Encounter: Payer: Self-pay | Admitting: Internal Medicine

## 2016-11-07 ENCOUNTER — Telehealth: Payer: Self-pay | Admitting: Internal Medicine

## 2016-11-07 ENCOUNTER — Ambulatory Visit (AMBULATORY_SURGERY_CENTER): Payer: Self-pay

## 2016-11-07 VITALS — Ht 67.0 in | Wt 183.6 lb

## 2016-11-07 DIAGNOSIS — Z86010 Personal history of colonic polyps: Secondary | ICD-10-CM

## 2016-11-07 MED ORDER — NA SULFATE-K SULFATE-MG SULF 17.5-3.13-1.6 GM/177ML PO SOLN: 1.0000 | Freq: Once | ORAL | 0 refills | Status: AC

## 2016-11-07 MED FILL — SUPREP BOWEL PREP KIT: 17.5-3.13-1 | 1 days supply | Qty: 354 | Fill #0

## 2016-11-07 NOTE — Progress Notes (Signed)
Denies allergies to eggs or soy products. Denies complication of anesthesia or sedation. Denies use of weight loss medication. Denies use of O2.   Emmi instructions given for colonoscopy.  

## 2016-11-07 NOTE — Telephone Encounter (Signed)
Patients pharmacy was called and given a pay no more than  $ 50.00 coupon. Insurance accepted the coupon and patient was Notified of the new cost for the prep. Patient will pick up prep.   Riki Sheer, LPN

## 2016-12-01 ENCOUNTER — Encounter: Payer: Self-pay | Admitting: Internal Medicine

## 2016-12-01 ENCOUNTER — Ambulatory Visit (AMBULATORY_SURGERY_CENTER): Payer: Medicare Other | Admitting: Internal Medicine

## 2016-12-01 VITALS — BP 136/79 | HR 54 | Temp 98.9°F | Resp 18 | Ht 67.0 in | Wt 183.0 lb

## 2016-12-01 DIAGNOSIS — D125 Benign neoplasm of sigmoid colon: Secondary | ICD-10-CM | POA: Diagnosis not present

## 2016-12-01 DIAGNOSIS — K6389 Other specified diseases of intestine: Secondary | ICD-10-CM | POA: Diagnosis not present

## 2016-12-01 DIAGNOSIS — Z86010 Personal history of colonic polyps: Secondary | ICD-10-CM | POA: Diagnosis not present

## 2016-12-01 DIAGNOSIS — D123 Benign neoplasm of transverse colon: Secondary | ICD-10-CM | POA: Diagnosis not present

## 2016-12-01 DIAGNOSIS — K635 Polyp of colon: Secondary | ICD-10-CM

## 2016-12-01 DIAGNOSIS — D126 Benign neoplasm of colon, unspecified: Secondary | ICD-10-CM | POA: Diagnosis not present

## 2016-12-01 DIAGNOSIS — D120 Benign neoplasm of cecum: Secondary | ICD-10-CM

## 2016-12-01 MED ORDER — SODIUM CHLORIDE 0.9 % IV SOLN: 500.0000 mL | INTRAVENOUS | Status: DC

## 2016-12-01 NOTE — Op Note (Signed)
[place] Patient Name: Daniel Rasmussen Procedure Date: 12/01/2016 1:50 PM MRN: [id number] Endoscopist: Jerene Bears , MD Age: 71 Referring MD:  Date of Birth: 08-14-1945 Gender: Male Account #: [id number] Procedure:                Colonoscopy Indications:              High risk colon cancer surveillance: Personal                            history of sessile serrated colon polyp (less than                            10 mm in size) with no dysplasia, Last colonoscopy:                            November 2013, incidental rectal bleeding Medicines:                Monitored Anesthesia Care Procedure:                Pre-Anesthesia Assessment:                           - Prior to the procedure, a History and Physical                            was performed, and patient medications and                            allergies were reviewed. The patient's tolerance of                            previous anesthesia was also reviewed. The risks                            and benefits of the procedure and the sedation                            options and risks were discussed with the patient.                            All questions were answered, and informed consent                            was obtained. Prior Anticoagulants: The patient has                            taken no previous anticoagulant or antiplatelet                            agents. ASA Grade Assessment: II - A patient with                            mild systemic disease. After reviewing the risks  and benefits, the patient was deemed in                            satisfactory condition to undergo the procedure.                           After obtaining informed consent, the colonoscope                            was passed under direct vision. Throughout the                            procedure, the patient's blood pressure, pulse, and                            oxygen saturations were  monitored continuously. The                            Colonoscope was introduced through the anus and                            advanced to the the cecum, identified by                            appendiceal orifice and ileocecal valve. The                            colonoscopy was performed without difficulty. The                            patient tolerated the procedure well. The quality                            of the bowel preparation was good. The ileocecal                            valve, appendiceal orifice, and rectum were                            photographed. Scope In: 1:57:01 PM Scope Out: 2:12:37 PM Scope Withdrawal Time: 0 hours 12 minutes 31 seconds  Total Procedure Duration: 0 hours 15 minutes 36 seconds  Findings:                 The digital rectal exam was normal.                           A 2 mm polyp was found in the cecum. The polyp was                            sessile. The polyp was removed with a cold biopsy                            forceps. Resection and retrieval were complete.  Two sessile polyps were found in the ascending                            colon. The polyps were 2 to 3 mm in size. These                            polyps were removed with a cold biopsy forceps.                            Resection and retrieval were complete.                           Two sessile polyps were found in the transverse                            colon. The polyps were 4 to 5 mm in size. These                            polyps were removed with a cold snare. Resection                            and retrieval were complete.                           A 5 mm polyp was found in the sigmoid colon. The                            polyp was sessile. The polyp was removed with a                            cold snare. Resection and retrieval were complete.                           Multiple small-mouthed diverticula were found in                             the descending colon.                           Internal hemorrhoids were found during                            retroflexion. The hemorrhoids were small. Complications:            No immediate complications. Estimated Blood Loss:     Estimated blood loss was minimal. Impression:               - One 2 mm polyp in the cecum, removed with a cold                            biopsy forceps. Resected and retrieved.                           - Two 2 to  3 mm polyps in the ascending colon,                            removed with a cold biopsy forceps. Resected and                            retrieved.                           - Two 4 to 5 mm polyps in the transverse colon,                            removed with a cold snare. Resected and retrieved.                           - One 5 mm polyp in the sigmoid colon, removed with                            a cold snare. Resected and retrieved.                           - Diverticulosis in the descending colon.                           - Internal hemorrhoids. Recommendation:           - Patient has a contact number available for                            emergencies. The signs and symptoms of potential                            delayed complications were discussed with the                            patient. Return to normal activities tomorrow.                            Written discharge instructions were provided to the                            patient.                           - Resume previous diet.                           - Continue present medications.                           - Await pathology results.                           - Repeat colonoscopy is recommended. The  colonoscopy date will be determined after pathology                            results from today's exam become available for                            review. Jerene Bears, MD 12/01/2016 2:18:28 PM This report has been signed  electronically.

## 2016-12-01 NOTE — Patient Instructions (Signed)
Discharge instructions given. Handouts on polyps,diverticulosis and hemorrhoids. Resume previous medications. YOU HAD AN ENDOSCOPIC PROCEDURE TODAY AT THE [facility]:   Refer to the procedure report that was given to you for any specific questions about what was found during the examination.  If the procedure report does not answer your questions, please call your gastroenterologist to clarify.  If you requested that your care partner not be given the details of your procedure findings, then the procedure report has been included in a sealed envelope for you to review at your convenience later.  YOU SHOULD EXPECT: Some feelings of bloating in the abdomen. Passage of more gas than usual.  Walking can help get rid of the air that was put into your GI tract during the procedure and reduce the bloating. If you had a lower endoscopy (such as a colonoscopy or flexible sigmoidoscopy) you may notice spotting of blood in your stool or on the toilet paper. If you underwent a bowel prep for your procedure, you may not have a normal bowel movement for a few days.  Please Note:  You might notice some irritation and congestion in your nose or some drainage.  This is from the oxygen used during your procedure.  There is no need for concern and it should clear up in a day or so.  SYMPTOMS TO REPORT IMMEDIATELY:   Following lower endoscopy (colonoscopy or flexible sigmoidoscopy):  Excessive amounts of blood in the stool  Significant tenderness or worsening of abdominal pains  Swelling of the abdomen that is new, acute  Fever of 100F or higher   For urgent or emergent issues, a gastroenterologist can be reached at any hour by calling [phone].   DIET:  We do recommend a small meal at first, but then you may proceed to your regular diet.  Drink plenty of fluids but you should avoid alcoholic beverages for 24 hours.  ACTIVITY:  You should plan to take it easy for the rest of today and you  should NOT DRIVE or use heavy machinery until tomorrow (because of the sedation medicines used during the test).    FOLLOW UP: Our staff will call the number listed on your records the next business day following your procedure to check on you and address any questions or concerns that you may have regarding the information given to you following your procedure. If we do not reach you, we will leave a message.  However, if you are feeling well and you are not experiencing any problems, there is no need to return our call.  We will assume that you have returned to your regular daily activities without incident.  If any biopsies were taken you will be contacted by phone or by letter within the next 1-3 weeks.  Please call us at [phone] if you have not heard about the biopsies in 3 weeks.    SIGNATURES/CONFIDENTIALITY: You and/or your care partner have signed paperwork which will be entered into your electronic medical record.  These signatures attest to the fact that that the information above on your After Visit Summary has been reviewed and is understood.  Full responsibility of the confidentiality of this discharge information lies with you and/or your care-partner. 

## 2016-12-01 NOTE — Progress Notes (Signed)
Called to room to assist during endoscopic procedure.  Patient ID and intended procedure confirmed with present staff. Received instructions for my participation in the procedure from the performing physician.  

## 2016-12-01 NOTE — Progress Notes (Signed)
Alert and oriented x3, pleased with MAC, report to RN

## 2016-12-02 ENCOUNTER — Telehealth: Payer: Self-pay | Admitting: *Deleted

## 2016-12-02 NOTE — Telephone Encounter (Signed)
  Follow up Call-  Call back number 12/01/2016  Post procedure Call Back phone  # #[phone] cell  Permission to leave phone message Yes  Some recent data might be hidden     Patient questions:  Do you have a fever, pain , or abdominal swelling? No. Pain Score  0 *  Have you tolerated food without any problems? Yes.    Have you been able to return to your normal activities? Yes.    Do you have any questions about your discharge instructions: Diet   No. Medications  No. Follow up visit  No.  Do you have questions or concerns about your Care? No.  Actions: * If pain score is 4 or above: No action needed, pain <4.

## 2016-12-07 ENCOUNTER — Encounter: Payer: Self-pay | Admitting: Internal Medicine

## 2016-12-09 ENCOUNTER — Encounter: Payer: Self-pay | Admitting: *Deleted

## 2016-12-12 ENCOUNTER — Encounter: Payer: Self-pay | Admitting: Internal Medicine

## 2016-12-12 ENCOUNTER — Ambulatory Visit (INDEPENDENT_AMBULATORY_CARE_PROVIDER_SITE_OTHER): Payer: Medicare Other | Admitting: Internal Medicine

## 2016-12-12 VITALS — BP 126/68 | HR 75 | Ht 66.0 in | Wt 180.0 lb

## 2016-12-12 DIAGNOSIS — K648 Other hemorrhoids: Secondary | ICD-10-CM | POA: Diagnosis not present

## 2016-12-12 NOTE — Patient Instructions (Signed)
You have been scheduled for your 2nd hemorrhoidal banding on 02/08/17 @ 3:45 pm. If you need to reschedule this appointment for a different date, please give [place] at least several days notice.  HEMORRHOID BANDING PROCEDURE    FOLLOW-UP CARE   1. The procedure you have had should have been relatively painless since the banding of the area involved does not have nerve endings and there is no pain sensation.  The rubber band cuts off the blood supply to the hemorrhoid and the band may fall off as soon as 48 hours after the banding (the band may occasionally be seen in the toilet bowl following a bowel movement). You may notice a temporary feeling of fullness in the rectum which should respond adequately to plain Tylenol or Motrin.  2. Following the banding, avoid strenuous exercise that evening and resume full activity the next day.  A sitz bath (soaking in a warm tub) or bidet is soothing, and can be useful for cleansing the area after bowel movements.     3. To avoid constipation, take two tablespoons of natural wheat bran, natural oat bran, flax, Benefiber or any over the counter fiber supplement and increase your water intake to 7-8 glasses daily.    4. Unless you have been prescribed anorectal medication, do not put anything inside your rectum for two weeks: No suppositories, enemas, fingers, etc.  5. Occasionally, you may have more bleeding than usual after the banding procedure.  This is often from the untreated hemorrhoids rather than the treated one.  Don't be concerned if there is a tablespoon or so of blood.  If there is more blood than this, lie flat with your bottom higher than your head and apply an ice pack to the area. If the bleeding does not stop within a half an hour or if you feel faint, call our office at [phone] or go to the emergency room.  6. Problems are not common; however, if there is a substantial amount of bleeding, severe pain, chills, fever or difficulty passing  urine (very rare) or other problems, you should call us at [phone] or report to the nearest emergency room.  7. Do not stay seated continuously for more than 2-3 hours for a day or two after the procedure.  Tighten your buttock muscles 10-15 times every two hours and take 10-15 deep breaths every 1-2 hours.  Do not spend more than a few minutes on the toilet if you cannot empty your bowel; instead re-visit the toilet at a later time.

## 2016-12-12 NOTE — Progress Notes (Signed)
Daniel Rasmussen is a 71 year old male with a history of internal hemorrhoids. He very recently had a colonoscopy which was performed on 12/01/2016. This revealed 6 polyps which were removed ranging in size from 2-5 mm. Only one of these was a tubular adenoma the others were benign lymphoid and hyperplastic polyps. Internal hemorrhoids were found on retroflexion. Diverticulosis was found in the descending colon.  He reports he has had intermittent issues with internal hemorrhoidal symptoms. Primarily bleeding which is noted on occasion after bowel movement but also after activity. There are times when he reports he will go for a long walk and have red blood, painless, in his underwear. He has had issues with some prolapsing hemorrhoidal symptoms as well. No recent rectal bleeding however. No melena.   PROCEDURE NOTE:  The patient presents with symptomatic grade 2 internal hemorrhoids, requesting rubber band ligation of his hemorrhoidal disease.  All risks, benefits and alternative forms of therapy were described and informed consent was obtained.   The anorectum was pre-medicated with 0.125% nitroglycerin ointment The decision was made to band the right anterior (RA) internal hemorrhoid, and the [place] was used to perform band ligation without complication.   Digital anorectal examination was then performed to assure proper positioning of the band, and to adjust the banded tissue as required.  The patient was discharged home without pain or other issues.  Dietary and behavioral recommendations were given and along with follow-up instructions.     The patient will return as scheduled for  follow-up and possible additional banding as required. No complications were encountered and the patient tolerated the procedure well.

## 2016-12-28 ENCOUNTER — Encounter: Payer: Self-pay | Admitting: Internal Medicine

## 2016-12-28 ENCOUNTER — Ambulatory Visit (INDEPENDENT_AMBULATORY_CARE_PROVIDER_SITE_OTHER): Payer: Medicare Other | Admitting: Internal Medicine

## 2016-12-28 VITALS — BP 124/70 | HR 61 | Temp 97.8°F | Resp 14 | Ht 66.0 in | Wt 181.0 lb

## 2016-12-28 DIAGNOSIS — Z09 Encounter for follow-up examination after completed treatment for conditions other than malignant neoplasm: Secondary | ICD-10-CM

## 2016-12-28 DIAGNOSIS — E559 Vitamin D deficiency, unspecified: Secondary | ICD-10-CM | POA: Diagnosis not present

## 2016-12-28 DIAGNOSIS — Z114 Encounter for screening for human immunodeficiency virus [HIV]: Secondary | ICD-10-CM

## 2016-12-28 DIAGNOSIS — R739 Hyperglycemia, unspecified: Secondary | ICD-10-CM | POA: Diagnosis not present

## 2016-12-28 DIAGNOSIS — R5383 Other fatigue: Secondary | ICD-10-CM

## 2016-12-28 LAB — HEMOGLOBIN A1C: Hgb A1c MFr Bld: 6 % (ref 4.6–6.5)

## 2016-12-28 LAB — HIV ANTIBODY (ROUTINE TESTING W REFLEX): HIV 1&2 Ab, 4th Generation: NONREACTIVE

## 2016-12-28 MED ORDER — SILDENAFIL CITRATE 20 MG PO TABS: 60.0000 mg | ORAL_TABLET | Freq: Every evening | ORAL | 3 refills | Status: DC | PRN

## 2016-12-28 MED FILL — SILDENAFIL 20 MG TABLET: 20 | 10 days supply | Qty: 30 | Fill #0

## 2016-12-28 NOTE — Patient Instructions (Signed)
GO TO THE LAB : Get the blood work     GO TO THE FRONT DESK Schedule your next appointment for a  physical exam in 5-6 months   Continue your vitamin D over-the-counter supplements  Use sildenafil (Viagra) as needed, watch for side effects.

## 2016-12-28 NOTE — Assessment & Plan Note (Addendum)
Prediabetes: Concept of A1c d/w pt, A1Cs have been stable,rec a healthy diet and to remain active. Check a A1c Vitamin D deficiency: Was prescribed ergocalciferol, now on over-the-counter supplements, check labs. Fatigue, Depression: See last visit, sx resolved, w/u (-) except for vitamin D deficiency. Pt thinks sxs were related to his upcoming 32 birthday; he is counseled. ED: Has very mild sx but "likes to be prepared". Tried Viagra before , no s/e, rx  printed, s/e and how to use it discussed. Request HIV testing. We'll do RTC 6 months, CPX

## 2016-12-28 NOTE — Progress Notes (Addendum)
Subjective:    Patient ID: Daniel Rasmussen, male    DOB: 02-05-1946, 71 y.o.   MRN: [id number]  DOS:  12/28/2016 Type of visit - description : f/u Interval history: Since the last visits, fatigue and depression improved. Vitamin D deficiency: Was prescribed ergocalciferol, now on OTCs. Concerned about prediabetes. Would like to be check for HIV Would like to have a Viagra prescription, tried it once before with no apparent side effects.  Wt Readings from Last 3 Encounters:  12/28/16 181 lb (82.1 kg)  12/12/16 180 lb (81.6 kg)  12/01/16 183 lb (83 kg)     Review of Systems No chest pain or difficulty breathing. No nausea, vomiting, diarrhea  Past Medical History:  Diagnosis Date  . Adrenal adenoma 12/25/2012   9 mm,incidental finding on CT 12/22/12   . Diverticulosis   . Epididymitis 02.2010  . Granulomatous lung disease ([place]) 12/25/2012   12/22/12 asymptomatic finding on chest x-ray as calcified granuloma in the posterior right base   . Hemorrhoid    Inflammation  . Hepatic hemangioma 12/25/2012   12/22/12 incidental finding of 3 small masses in the anterior segment of the right lobe of the liver which probably represent hemangioma. The largest lesion measured 1.4 x 1.0 cm in size. Followup recommended in 06/2013 to confirm stability   . Internal hemorrhoids   . Plantar fasciitis 02.2010  . Prediabetes 03/15/2012  . Renal calculi   . Serrated adenoma of colon   . Spinal stenosis of lumbar region 10.2006   L4-L5, Mild discogenic disease  . Tubular adenoma of colon     Past Surgical History:  Procedure Laterality Date  . COLONOSCOPY    . facial plastic surery    . NO PAST SURGERIES    . POLYPECTOMY      Social History   Social History  . Marital status: Single    Spouse name: N/A  . Number of children: 0  . Years of education: N/A   Occupational History  . Retired, works part time, Pharmacologist    Social History Main Topics  . Smoking status: Former Smoker   Packs/day: 0.25    Years: 10.00    Types: Cigarettes    Start date: 06/20/1980  . Smokeless tobacco: Never Used     Comment: >30 yrs.  . Alcohol use 12.6 oz/week    14 Glasses of wine, 7 Standard drinks or equivalent per week     Comment: martini 1-2 x a week  . Drug use: No  . Sexual activity: No   Other Topics Concern  . Not on file   Social History Narrative   Lives by himself.      Allergies as of 12/28/2016   No Known Allergies     Medication List       Accurate as of 12/28/16  5:48 PM. Always use your most recent med list.          sildenafil 20 MG tablet Commonly known as:  REVATIO Take 3-4 tablets (60-80 mg total) by mouth at bedtime as needed.   VITAMIN D PO Take 1,000 Units by mouth daily.          Objective:   Physical Exam BP 124/70 (BP Location: Left Arm, Patient Position: Sitting, Cuff Size: Normal)   Pulse 61   Temp 97.8 F (36.6 C) (Oral)   Resp 14   Ht 5\' 6"  (1.676 m)   Wt 181 lb (82.1 kg)   SpO2 98%  BMI 29.21 kg/m  General:   Well developed, well nourished . NAD.  HEENT:  Normocephalic . Face symmetric, atraumatic Lungs:  CTA B Normal respiratory effort, no intercostal retractions, no accessory muscle use. Heart: RRR,  no murmur.  No pretibial edema bilaterally  Skin: Not pale. Not jaundice Neurologic:  alert & oriented X3.  Speech normal, gait appropriate for age and unassisted Psych--  Cognition and judgment appear intact.  Cooperative with normal attention span and concentration.  Behavior appropriate. No anxious or depressed appearing.      Assessment & Plan:    Assessment- transfer from Dr Linna Darner 07-2014 Prediabetes Borderline HTN  RBBB- saw cards 08-2015, no further eval MSK:h/o  Spinal stenosis Hepatic hemangioma 2014 incidental finding Adrenal adenoma 2014 incidental finding H/o Ca+ granuloma, chest [place] Ao 08-2015 -- no AAA Vit D def  PLAN Prediabetes: Concept of A1c d/w pt, A1Cs have been stable,rec a  healthy diet and to remain active. Check a A1c Vitamin D deficiency: Was prescribed ergocalciferol, now on over-the-counter supplements, check labs. Fatigue, Depression: See last visit, sx resolved, w/u (-) except for vitamin D deficiency. Pt thinks sxs were related to his upcoming 66 birthday; he is counseled. ED: Has very mild sx but "likes to be prepared". Tried Viagra before , no s/e, rx  printed, s/e and how to use it discussed. Request HIV testing. We'll do RTC 6 months, CPX

## 2016-12-28 NOTE — Progress Notes (Signed)
Pre visit review using our clinic review tool, if applicable. No additional management support is needed unless otherwise documented below in the visit note. 

## 2016-12-31 LAB — VITAMIN D 1,25 DIHYDROXY
VITAMIN D 1, 25 (OH) TOTAL: 36 pg/mL (ref 18–72)
Vitamin D2 1, 25 (OH)2: 11 pg/mL
Vitamin D3 1, 25 (OH)2: 25 pg/mL

## 2017-01-02 ENCOUNTER — Encounter: Payer: Self-pay | Admitting: Internal Medicine

## 2017-01-02 ENCOUNTER — Ambulatory Visit (INDEPENDENT_AMBULATORY_CARE_PROVIDER_SITE_OTHER): Payer: Medicare Other | Admitting: Internal Medicine

## 2017-01-02 VITALS — BP 110/60 | HR 68 | Wt 179.0 lb

## 2017-01-02 DIAGNOSIS — K648 Other hemorrhoids: Secondary | ICD-10-CM | POA: Diagnosis not present

## 2017-01-02 NOTE — Patient Instructions (Signed)

## 2017-01-02 NOTE — Progress Notes (Signed)
Daniel Rasmussen is a 71 year old male with history of symptomatic bleeding internal hemorrhoids who presents for repeat banding  Initial banding performed on 12/12/2016 to the right anterior internal hemorrhoid He has not seen any further bleeding including with activity. He's noticed some mild rectal soreness particular with activity since initial banding. This is very mild but has been persistent   PROCEDURE NOTE:  The patient presents with symptomatic grade 2 internal hemorrhoids, requesting rubber band ligation of his hemorrhoidal disease.  All risks, benefits and alternative forms of therapy were described and informed consent was obtained.   The anorectum was pre-medicated with 0.125% nitroglycerin ointment The decision was made to band the LL internal hemorrhoid, and the [place] was used to perform band ligation without complication.   Digital anorectal examination was then performed to assure proper positioning of the band, and to adjust the banded tissue as required.  The patient was discharged home without pain or other issues.  Dietary and behavioral recommendations were given and along with follow-up instructions.    The patient will return as needed for  follow-up and possible additional banding as required. He will be away in [place] over the next 6 weeks. At this time we will not schedule a third banding visit unless he has further symptoms i.e. more bleeding. The rectal discomfort that he is feeling is presumed to be from a post banding ulcer from initial hemorrhoidal banding about 5 weeks ago. I asked that he notify me if this fails to improve and resolve completely. He voices understanding  No complications were encountered and the patient tolerated the procedure well.

## 2017-02-08 ENCOUNTER — Encounter: Payer: Medicare Other | Admitting: Internal Medicine

## 2017-04-12 DIAGNOSIS — Z23 Encounter for immunization: Secondary | ICD-10-CM | POA: Diagnosis not present

## 2017-05-26 DIAGNOSIS — H2513 Age-related nuclear cataract, bilateral: Secondary | ICD-10-CM | POA: Diagnosis not present

## 2017-05-26 DIAGNOSIS — H43813 Vitreous degeneration, bilateral: Secondary | ICD-10-CM | POA: Diagnosis not present

## 2017-06-26 ENCOUNTER — Ambulatory Visit (INDEPENDENT_AMBULATORY_CARE_PROVIDER_SITE_OTHER): Payer: Medicare Other | Admitting: Psychology

## 2017-06-26 DIAGNOSIS — F331 Major depressive disorder, recurrent, moderate: Secondary | ICD-10-CM

## 2017-07-06 ENCOUNTER — Ambulatory Visit (INDEPENDENT_AMBULATORY_CARE_PROVIDER_SITE_OTHER): Payer: Medicare Other | Admitting: Psychology

## 2017-07-06 DIAGNOSIS — F331 Major depressive disorder, recurrent, moderate: Secondary | ICD-10-CM

## 2017-08-04 ENCOUNTER — Ambulatory Visit (INDEPENDENT_AMBULATORY_CARE_PROVIDER_SITE_OTHER): Payer: Medicare Other | Admitting: Psychology

## 2017-08-04 DIAGNOSIS — F331 Major depressive disorder, recurrent, moderate: Secondary | ICD-10-CM | POA: Diagnosis not present

## 2017-08-28 ENCOUNTER — Ambulatory Visit: Payer: Medicare Other | Admitting: Psychology

## 2017-09-08 ENCOUNTER — Ambulatory Visit (INDEPENDENT_AMBULATORY_CARE_PROVIDER_SITE_OTHER): Payer: Medicare Other | Admitting: Internal Medicine

## 2017-09-08 ENCOUNTER — Encounter: Payer: Self-pay | Admitting: Internal Medicine

## 2017-09-08 VITALS — BP 126/72 | HR 62 | Temp 97.8°F | Resp 14 | Ht 66.0 in | Wt 177.5 lb

## 2017-09-08 DIAGNOSIS — R739 Hyperglycemia, unspecified: Secondary | ICD-10-CM | POA: Diagnosis not present

## 2017-09-08 DIAGNOSIS — R030 Elevated blood-pressure reading, without diagnosis of hypertension: Secondary | ICD-10-CM | POA: Diagnosis not present

## 2017-09-08 DIAGNOSIS — E559 Vitamin D deficiency, unspecified: Secondary | ICD-10-CM

## 2017-09-08 DIAGNOSIS — N400 Enlarged prostate without lower urinary tract symptoms: Secondary | ICD-10-CM | POA: Diagnosis not present

## 2017-09-08 DIAGNOSIS — E785 Hyperlipidemia, unspecified: Secondary | ICD-10-CM | POA: Diagnosis not present

## 2017-09-08 DIAGNOSIS — E875 Hyperkalemia: Secondary | ICD-10-CM | POA: Diagnosis not present

## 2017-09-08 LAB — COMPREHENSIVE METABOLIC PANEL
ALT: 19 U/L (ref 0–53)
AST: 26 U/L (ref 0–37)
Albumin: 4.3 g/dL (ref 3.5–5.2)
Alkaline Phosphatase: 45 U/L (ref 39–117)
BUN: 17 mg/dL (ref 6–23)
CHLORIDE: 102 meq/L (ref 96–112)
CO2: 28 meq/L (ref 19–32)
CREATININE: 0.9 mg/dL (ref 0.40–1.50)
Calcium: 9.7 mg/dL (ref 8.4–10.5)
GFR: 88.5 mL/min (ref >60.00)
Glucose, Bld: 99 mg/dL (ref 70–99)
Potassium: 5.3 mEq/L — ABNORMAL HIGH (ref 3.5–5.1)
SODIUM: 139 meq/L (ref 135–145)
Total Bilirubin: 1.2 mg/dL (ref 0.2–1.2)
Total Protein: 6.7 g/dL (ref 6.0–8.3)

## 2017-09-08 LAB — LIPID PANEL
CHOL/HDL RATIO: 3
Cholesterol: 187 mg/dL (ref 0–200)
HDL: 74.3 mg/dL (ref >39.00)
LDL CALC: 94 mg/dL (ref 0–99)
NonHDL: 112.91
Triglycerides: 94 mg/dL (ref 0.0–149.0)
VLDL: 18.8 mg/dL (ref 0.0–40.0)

## 2017-09-08 LAB — HEMOGLOBIN A1C: HEMOGLOBIN A1C: 5.9 % (ref 4.6–6.5)

## 2017-09-08 LAB — PSA: PSA: 1.34 ng/mL (ref 0.10–4.00)

## 2017-09-08 MED ORDER — ZOLPIDEM TARTRATE 5 MG PO TABS: 5.0000 mg | ORAL_TABLET | Freq: Every evening | ORAL | 1 refills | Status: DC | PRN

## 2017-09-08 MED FILL — ZOLPIDEM TARTRATE 5 MG TABL: 5 | 30 days supply | Qty: 30 | Fill #0

## 2017-09-08 NOTE — Progress Notes (Signed)
Subjective:    Patient ID: Daniel Rasmussen, male    DOB: 1945-09-14, 72 y.o.   MRN: [id number]  DOS:  09/08/2017 Type of visit - description : rov Interval history: Pre-DM: Doing well with diet, goes to the gym twice a week. Borderline elevated BP: No ambulatory BPs Stress, anxiety: Sees a counselor, here lately has been unable to sleep, usually wakes up at 3 AM and cannot go back.  Has tried a number of OTCs, no help. Has on and off left-sided back pain, no radiation, not necessarily change with moving.  Wonders if he has a kidney stone.  See ROS   Review of Systems No chest pain or difficulty breathing No nausea, vomiting, diarrhea No fever, chills. No dysuria, gross hematuria.   Past Medical History:  Diagnosis Date  . Adrenal adenoma 12/25/2012   9 mm,incidental finding on CT 12/22/12   . Diverticulosis   . Epididymitis 02.2010  . Granulomatous lung disease ([place]) 12/25/2012   12/22/12 asymptomatic finding on chest x-ray as calcified granuloma in the posterior right base   . Hemorrhoid    Inflammation  . Hepatic hemangioma 12/25/2012   12/22/12 incidental finding of 3 small masses in the anterior segment of the right lobe of the liver which probably represent hemangioma. The largest lesion measured 1.4 x 1.0 cm in size. Followup recommended in 06/2013 to confirm stability   . Internal hemorrhoids   . Plantar fasciitis 02.2010  . Prediabetes 03/15/2012  . Renal calculi   . Serrated adenoma of colon   . Spinal stenosis of lumbar region 10.2006   L4-L5, Mild discogenic disease  . Tubular adenoma of colon     Past Surgical History:  Procedure Laterality Date  . COLONOSCOPY    . facial plastic surery    . POLYPECTOMY      Social History   Socioeconomic History  . Marital status: Single    Spouse name: Not on file  . Number of children: 0  . Years of education: Not on file  . Highest education level: Not on file  Occupational History  . Occupation: Retired, works part  time, Doctor, hospital  . Financial resource strain: Not on file  . Food insecurity:    Worry: Not on file    Inability: Not on file  . Transportation needs:    Medical: Not on file    Non-medical: Not on file  Tobacco Use  . Smoking status: Former Smoker    Packs/day: 0.25    Years: 10.00    Pack years: 2.50    Types: Cigarettes    Start date: 06/20/1980  . Smokeless tobacco: Never Used  . Tobacco comment: >30 yrs.  Substance and Sexual Activity  . Alcohol use: Yes    Alcohol/week: 12.6 oz    Types: 14 Glasses of wine, 7 Standard drinks or equivalent per week    Comment: martini 1-2 x a week  . Drug use: No  . Sexual activity: Never  Lifestyle  . Physical activity:    Days per week: Not on file    Minutes per session: Not on file  . Stress: Not on file  Relationships  . Social connections:    Talks on phone: Not on file    Gets together: Not on file    Attends religious service: Not on file    Active member of club or organization: Not on file    Attends meetings of clubs or organizations: Not on file  Relationship status: Not on file  . Intimate partner violence:    Fear of current or ex partner: Not on file    Emotionally abused: Not on file    Physically abused: Not on file    Forced sexual activity: Not on file  Other Topics Concern  . Not on file  Social History Narrative   Lives by himself.     Family History  Problem Relation Age of Onset  . Aortic aneurysm Father        Deceased  . Breast cancer Mother   . Healthy Sister   . Colon cancer Neg Hx   . Prostate cancer Neg Hx   . Esophageal cancer Neg Hx   . Rectal cancer Neg Hx   . Stomach cancer Neg Hx   . Pancreatic cancer Neg Hx      Allergies as of 09/08/2017   No Known Allergies     Medication List        Accurate as of 09/08/17  5:37 PM. Always use your most recent med list.          VITAMIN D PO Take 1,000 Units by mouth daily.   zolpidem 5 MG tablet Commonly known as:   AMBIEN Take 1 tablet (5 mg total) by mouth at bedtime as needed for sleep.          Objective:   Physical Exam BP 126/72 (BP Location: Left Arm, Patient Position: Sitting, Cuff Size: Small)   Pulse 62   Temp 97.8 F (36.6 C) (Oral)   Resp 14   Ht 5\' 6"  (1.676 m)   Wt 177 lb 8 oz (80.5 kg)   SpO2 96%   BMI 28.65 kg/m  General:   Well developed, well nourished . NAD.  Neck: No  thyromegaly  HEENT:  Normocephalic . Face symmetric, atraumatic Lungs:  CTA B Normal respiratory effort, no intercostal retractions, no accessory muscle use. Heart: RRR,  no murmur.  No pretibial edema bilaterally  Abdomen:  Not distended, soft, non-tender. No rebound or rigidity.   Skin: Exposed areas without rash. Not pale. Not jaundice Rectal: External abnormalities: Few external hemorrhoids.  Normal sphincter tone. No rectal masses or tenderness.  Brown stools Prostate: Prostate gland firm and smooth, minimal enlargement if any.  No nodularity, tender, mass or asymmetry. Neurologic:  alert & oriented X3.  Speech normal, gait appropriate for age and unassisted Strength symmetric and appropriate for age.  Psych: Cognition and judgment appear intact.  Cooperative with normal attention span and concentration.  Behavior appropriate. No anxious or depressed appearing.     Assessment & Plan:   Assessment- transfer from Dr Linna Darner 07-2014 Prediabetes Borderline HTN  RBBB- saw cards 08-2015, no further eval MSK:h/o  Spinal stenosis Hepatic hemangioma 2014 incidental finding Adrenal adenoma 2014 incidental finding H/o Ca+ granuloma, chest [place] Ao 08-2015 -- no AAA Vit D def H/o urolithiasis   PLAN Prediabetes: Doing great with diet and exercise.  Check A1c Borderline HTN?Marland Kitchen  No ambulatory BPs, BP today is normal, check a CMP Elevated LDL in 2016 it was 224, check a FLP Back pain: As above, patient wonders about kidney stone but that is unlikely based on ROS.  Recommend stretching,  Tylenol. Vitamin D deficiency: Recheck again, recommend vitamin D 1000 units daily Anxiety, depression , stress, insomnia: He sees our counselor Coralyn Mark, needs help sleeping, tips provided, trial with Ambien 5 mg. RTC one year, sooner if needed.

## 2017-09-08 NOTE — Progress Notes (Signed)
Pre visit review using our clinic review tool, if applicable. No additional management support is needed unless otherwise documented below in the visit note. 

## 2017-09-08 NOTE — Assessment & Plan Note (Signed)
Td-- 2016; Prevnar 2016; pnm 23: 2017; Had a Zostavax; shingrix not available  -Prostate cancer screening: DRE minimal prostate enlargement on DRE today.  Check a PSA -[facility]: Cscope  04-2012 showed polyps, Cscope again 11-2016, 5 years per letter  - Diet and exercise: He is doing very well

## 2017-09-08 NOTE — Assessment & Plan Note (Signed)
Prediabetes: Doing great with diet and exercise.  Check A1c Borderline HTN?Marland Kitchen  No ambulatory BPs, BP today is normal, check a CMP Elevated LDL in 2016 it was 224, check a FLP Back pain: As above, patient wonders about kidney stone but that is unlikely based on ROS.  Recommend stretching, Tylenol. Vitamin D deficiency: Recheck again, recommend vitamin D 1000 units daily Anxiety, depression , stress, insomnia: He sees our counselor Coralyn Mark, needs help sleeping, tips provided, trial with Ambien 5 mg. RTC one year, sooner if needed.

## 2017-09-08 NOTE — Patient Instructions (Addendum)
GO TO THE LAB : Get the blood work     GO TO THE FRONT DESK Schedule your next appointment for a routine checkup in 1 year.  Try Ambien 5 mg at bedtime to help his sleep  Vitamin D 1000 units: 1 tablet daily  HEALTHY SLEEP Sleep hygiene: Basic rules for a good night's sleep  Sleep only as much as you need to feel rested and then get out of bed  Keep a regular sleep schedule  Avoid forcing sleep  Exercise regularly for at least 20 minutes, preferably 4 to 5 hours before bedtime  Avoid caffeinated beverages after lunch  Avoid alcohol near bedtime: no "night cap"  Avoid smoking, especially in the evening  Do not go to bed hungry  Adjust bedroom environment  Avoid prolonged use of light-emitting screens before bedtime   Deal with your worries before bedtime   \

## 2017-09-12 ENCOUNTER — Ambulatory Visit: Payer: Medicare Other | Admitting: Psychology

## 2017-09-12 LAB — VITAMIN D 1,25 DIHYDROXY
Vitamin D 1, 25 (OH)2 Total: 31 pg/mL (ref 18–72)
Vitamin D3 1, 25 (OH)2: 31 pg/mL

## 2017-09-12 NOTE — Addendum Note (Signed)
Addended byDamita Dunnings D on: 09/12/2017 11:58 AM   Modules accepted: Orders

## 2017-10-10 ENCOUNTER — Ambulatory Visit (INDEPENDENT_AMBULATORY_CARE_PROVIDER_SITE_OTHER): Payer: Medicare Other | Admitting: Psychology

## 2017-10-10 DIAGNOSIS — F331 Major depressive disorder, recurrent, moderate: Secondary | ICD-10-CM | POA: Diagnosis not present

## 2017-12-04 ENCOUNTER — Ambulatory Visit (INDEPENDENT_AMBULATORY_CARE_PROVIDER_SITE_OTHER): Payer: Medicare Other | Admitting: Psychology

## 2017-12-04 DIAGNOSIS — F331 Major depressive disorder, recurrent, moderate: Secondary | ICD-10-CM | POA: Diagnosis not present

## 2018-01-01 ENCOUNTER — Ambulatory Visit (INDEPENDENT_AMBULATORY_CARE_PROVIDER_SITE_OTHER): Payer: Medicare Other | Admitting: Psychology

## 2018-01-01 DIAGNOSIS — F331 Major depressive disorder, recurrent, moderate: Secondary | ICD-10-CM | POA: Diagnosis not present

## 2018-02-27 ENCOUNTER — Ambulatory Visit: Payer: Medicare Other | Admitting: Psychology

## 2018-03-05 ENCOUNTER — Ambulatory Visit (INDEPENDENT_AMBULATORY_CARE_PROVIDER_SITE_OTHER): Payer: Medicare Other | Admitting: Psychology

## 2018-03-05 DIAGNOSIS — F331 Major depressive disorder, recurrent, moderate: Secondary | ICD-10-CM | POA: Diagnosis not present

## 2018-03-07 ENCOUNTER — Encounter: Payer: Self-pay | Admitting: Internal Medicine

## 2018-03-07 DIAGNOSIS — S8391XA Sprain of unspecified site of right knee, initial encounter: Secondary | ICD-10-CM

## 2018-03-22 ENCOUNTER — Ambulatory Visit (INDEPENDENT_AMBULATORY_CARE_PROVIDER_SITE_OTHER): Payer: Medicare Other | Admitting: Family Medicine

## 2018-03-22 ENCOUNTER — Encounter: Payer: Self-pay | Admitting: Family Medicine

## 2018-03-22 DIAGNOSIS — S76111A Strain of right quadriceps muscle, fascia and tendon, initial encounter: Secondary | ICD-10-CM | POA: Diagnosis not present

## 2018-03-22 DIAGNOSIS — M76899 Other specified enthesopathies of unspecified lower limb, excluding foot: Secondary | ICD-10-CM | POA: Insufficient documentation

## 2018-03-22 NOTE — Patient Instructions (Signed)
You have a grade 1 vastus lateralis strain. Consider compression sleeve if this continues to be a problem. Activities as tolerated. Straight leg raises, knee extensions 3 sets of 10 once a day. Pick 1-3 more exercises from the handout. Add ankle weight if these become too easy. Ice/heat, tylenol, ibuprofen all only if needed. Consider physical therapy if not improving. Follow up with me in 1 month or as needed.

## 2018-03-22 NOTE — Progress Notes (Signed)
     Subjective: Chief Complaint  Patient presents with  . Leg Pain    right thigh x 6-8 weeks   HPI: Daniel Rasmussen is a 72 y.o. presenting to clinic today to discuss the following:  Right Thigh Pain Patient endorses having right thigh pain since about the beginning of July. He states he recalls no inciting fall or injury but he did use a different elliptical machine once and he noticed the pain started after that. He states it seems to occur after standing or laying down for long periods of time and when he gets up to move it will hurt. The pain is intermittent and feels like an ache. He states if he gets up and moves it will improve and he takes an occasional Motrin that also helps. The pain is described as staying lateral and goes from his hip to his knee. No popping or acute event that caused a great amount of pain. The pain is not really getting worse and he feels it is staying the same.  Note he is very physical active, exercises regularly.  Pain is a soreness at 0/10 currently but up to 7/10 level lateral right thigh.  He denies fever, chills, nausea, vomiting     ROS noted in HPI.   Past Medical, Surgical, Social, and Family History Reviewed & Updated per EMR.   Pertinent Historical Findings include:   Social History   Tobacco Use  Smoking Status Former Smoker  . Packs/day: 0.25  . Years: 10.00  . Pack years: 2.50  . Types: Cigarettes  . Start date: 06/20/1980  Smokeless Tobacco Never Used  Tobacco Comment   >30 yrs.    Objective: BP 125/77   Pulse 60   Ht 5\' 6"  (1.676 m)   Wt 170 lb (77.1 kg)   BMI 27.44 kg/m  Vitals and nursing notes reviewed  Physical Exam Gen: Alert and Oriented x 3, NAD HEENT: Normocephalic, atraumatic MSK: Moves all four extremities; 5/5 bilateral lower extremity strength, gross sensation intact. Right knee non-tender to palpation, mild crepitus, FROM, no swelling, erythema or warmth. Right hip FROM, non-tender to palpation at the  greater trochanter, minimal tenderness over vastus lateralis.  FABER and FADER test negative, negative straight leg raise on the right.  Left knee: No deformity. FROM with 5/5 strength. No tenderness to palpation. NVI distally.  Ext: no clubbing, cyanosis, or edema Neuro: No gross deficits Skin: warm, dry, intact, no rashes   No results found for this or any previous visit (from the past 72 hour(s)).  Assessment/Plan:  Right vastus lateralis strain - from overuse.  Shown home exercises to do daily.  Declined PT for now.  Ice/heat, tylenol, ibuprofen if needed.  Consider compression sleeve.  Activities as tolerated.  F/u in 1 month or prn.  PATIENT EDUCATION PROVIDED: See AVS    Diagnosis and plan along with any newly prescribed medication(s) were discussed in detail with this patient today. The patient verbalized understanding and agreed with the plan. Patient advised if symptoms worsen return to clinic or ER.   Health Maintainance:   No orders of the defined types were placed in this encounter.   No orders of the defined types were placed in this encounter.    Harolyn Rutherford, DO 03/22/2018, 10:28 AM PGY-2 [facility]

## 2018-03-22 NOTE — Assessment & Plan Note (Signed)
Grade 1 Quad strain. Pain is specific to the vastus lateralis. Given at home exercises and take OTC Motrin as needed. Patient will follow up in 4-6 weeks if needed.

## 2018-03-24 ENCOUNTER — Encounter: Payer: Self-pay | Admitting: Family Medicine

## 2018-03-26 ENCOUNTER — Ambulatory Visit (INDEPENDENT_AMBULATORY_CARE_PROVIDER_SITE_OTHER): Payer: Medicare Other | Admitting: Psychology

## 2018-03-26 DIAGNOSIS — F331 Major depressive disorder, recurrent, moderate: Secondary | ICD-10-CM

## 2018-04-10 ENCOUNTER — Encounter: Payer: Self-pay | Admitting: Internal Medicine

## 2018-04-10 DIAGNOSIS — M79651 Pain in right thigh: Secondary | ICD-10-CM

## 2018-04-17 ENCOUNTER — Other Ambulatory Visit (INDEPENDENT_AMBULATORY_CARE_PROVIDER_SITE_OTHER): Payer: Self-pay

## 2018-04-17 ENCOUNTER — Ambulatory Visit (INDEPENDENT_AMBULATORY_CARE_PROVIDER_SITE_OTHER): Payer: Medicare Other | Admitting: Orthopaedic Surgery

## 2018-04-17 ENCOUNTER — Ambulatory Visit (INDEPENDENT_AMBULATORY_CARE_PROVIDER_SITE_OTHER): Payer: Self-pay

## 2018-04-17 ENCOUNTER — Encounter (INDEPENDENT_AMBULATORY_CARE_PROVIDER_SITE_OTHER): Payer: Self-pay | Admitting: Orthopaedic Surgery

## 2018-04-17 ENCOUNTER — Ambulatory Visit (INDEPENDENT_AMBULATORY_CARE_PROVIDER_SITE_OTHER): Payer: Medicare Other

## 2018-04-17 DIAGNOSIS — M5431 Sciatica, right side: Secondary | ICD-10-CM

## 2018-04-17 DIAGNOSIS — Z23 Encounter for immunization: Secondary | ICD-10-CM | POA: Diagnosis not present

## 2018-04-17 DIAGNOSIS — G8929 Other chronic pain: Secondary | ICD-10-CM

## 2018-04-17 DIAGNOSIS — M5441 Lumbago with sciatica, right side: Principal | ICD-10-CM

## 2018-04-17 MED ORDER — CYCLOBENZAPRINE HCL 10 MG PO TABS: 10.0000 mg | ORAL_TABLET | Freq: Every day | ORAL | 1 refills | Status: DC

## 2018-04-17 MED ORDER — METHYLPREDNISOLONE 4 MG PO TABS: ORAL_TABLET | ORAL | 0 refills | Status: DC

## 2018-04-17 MED FILL — CYCLOBENZAPRINE HCL 10 MG T: 10 | 40 days supply | Qty: 40 | Fill #0

## 2018-04-17 MED FILL — METHYLPREDNISOLONE 4 MG TAB: 4 | 6 days supply | Qty: 21 | Fill #0

## 2018-04-17 NOTE — Progress Notes (Signed)
Office Visit Note   Patient: Daniel Rasmussen           Date of Birth: Nov 16, 1945           MRN: [id number] Visit Date: 04/17/2018              Requested by: Colon Branch, Daniel Rasm[address] [place], [place] [address] PCP: Colon Branch, MD   Assessment & Plan: Visit Diagnoses:  1. Sciatica, right side     Plan: We will refer him to physical therapy for back exercises core strengthening stretching home exercise program and modalities.  Also placed him on Medrol Dosepak and Flexeril at night.  He is to take no NSAIDs while on the Dosepak.  Follow-up with [place] in a month check his progress lack of.  Questions encouraged and answered at length by Dr. Ninfa Linden and myself.  Follow-Up Instructions: Return in about 4 weeks (around 05/15/2018).   Orders:  Orders Placed This Encounter  Procedures  . XR Lumbar Spine 2-3 Views  . XR Lumbar Spine Complete   No orders of the defined types were placed in this encounter.     Procedures: No procedures performed   Clinical Data: No additional findings.   Subjective: Chief Complaint  Patient presents with  . Right Leg - Pain    HPI HPI Daniel Rasmussen is a 72 year old male who had pain in his right thigh for the past 4 months.  No known injury.  He does state that his pain began in July after workout at the gym.  He is now having tingling down the right leg that originates in his buttocks area just anterior thigh lateral calf down to the ankle not into his feet.  Pain does awaken him.  He denies any bowel or bladder dysfunction.  He is tried some massage over the right buttocks area with a tennis ball.  Also tried some ibuprofen which helps some.  Review of Systems  Constitutional: Negative for chills and fever.  Respiratory: Negative for shortness of breath.   Cardiovascular: Negative for chest pain.  Gastrointestinal: Negative for constipation and diarrhea.  Genitourinary: Negative for difficulty urinating, dysuria, frequency  and urgency.     Objective: Vital Signs: There were no vitals taken for this visit.  Physical Exam  Constitutional: He is oriented to person, place, and time. He appears well-developed and well-nourished. No distress.  Cardiovascular: Intact distal pulses.  Pulmonary/Chest: Effort normal.  Neurological: He is alert and oriented to person, place, and time.  Skin: He is not diaphoretic.  Psychiatric: He has a normal mood and affect.    Ortho Exam Bilateral lower extremities 5 out of 5 strength throughout.  Negative straight leg raise bilaterally tight hamstrings bilaterally.  Full sensation bilateral feet light touch.  Deep tendon reflexes are 2+ at the knees and 1+ at the ankles and equal and symmetric.  Good range of motion bilateral hips without pain. Specialty Comments:  No specialty comments available.  Imaging: Xr Lumbar Spine 2-3 Views  Result Date: 04/17/2018 Lumbar spine AP and lateral views: No acute fracture.  Facet changes at L4-5 L5-S1.  Slight retrolisthesis of L2 on 3 and a grade 1 anterior spondylolisthesis at L4-5.  Normal lordotic curvature.    PMFS History: Patient Active Problem List   Diagnosis Date Noted  . Quadriceps tendinitis 03/22/2018  . Elevated BP without diagnosis of hypertension 08/16/2016  . Right knee pain 11/23/2015  . PCP NOTES >>>>>>>>>>>>>>>>>>>>>>>>>>>>>>>>>>>>>>>>> 08/09/2015  .  Annual physical exam 07/30/2014  . Tremor 07/30/2014  . Adrenal adenoma 12/25/2012  . Hepatic hemangioma 12/25/2012  . Granulomatous lung disease ([facility]) 12/25/2012  . Spinal stenosis of lumbar region 12/25/2012  . Hyperglycemia 03/15/2012  . Vitamin D deficiency 03/15/2012   Past Medical History:  Diagnosis Date  . Adrenal adenoma 12/25/2012   9 mm,incidental finding on CT 12/22/12   . Diverticulosis   . Epididymitis 02.2010  . Granulomatous lung disease ([facility]) 12/25/2012   12/22/12 asymptomatic finding on chest x-ray as calcified granuloma in the posterior right  base   . Hemorrhoid    Inflammation  . Hepatic hemangioma 12/25/2012   12/22/12 incidental finding of 3 small masses in the anterior segment of the right lobe of the liver which probably represent hemangioma. The largest lesion measured 1.4 x 1.0 cm in size. Followup recommended in 06/2013 to confirm stability   . Internal hemorrhoids   . Plantar fasciitis 02.2010  . Prediabetes 03/15/2012  . Renal calculi   . Serrated adenoma of colon   . Spinal stenosis of lumbar region 10.2006   L4-L5, Mild discogenic disease  . Tubular adenoma of colon     Family History  Problem Relation Age of Onset  . Aortic aneurysm Father        Deceased  . Breast cancer Mother   . Healthy Sister   . Colon cancer Neg Hx   . Prostate cancer Neg Hx   . Esophageal cancer Neg Hx   . Rectal cancer Neg Hx   . Stomach cancer Neg Hx   . Pancreatic cancer Neg Hx     Past Surgical History:  Procedure Laterality Date  . COLONOSCOPY    . facial plastic surery    . POLYPECTOMY     Social History   Occupational History  . Occupation: Retired, works part time, Armed forces logistics/support/administrative officer  . Smoking status: Former Smoker    Packs/day: 0.25    Years: 10.00    Pack years: 2.50    Types: Cigarettes    Start date: 06/20/1980  . Smokeless tobacco: Never Used  . Tobacco comment: >30 yrs.  Substance and Sexual Activity  . Alcohol use: Yes    Alcohol/week: 21.0 standard drinks    Types: 14 Glasses of wine, 7 Standard drinks or equivalent per week    Comment: martini 1-2 x a week  . Drug use: No  . Sexual activity: Never

## 2018-04-26 ENCOUNTER — Ambulatory Visit: Payer: Medicare Other | Admitting: Psychology

## 2018-05-15 ENCOUNTER — Encounter (INDEPENDENT_AMBULATORY_CARE_PROVIDER_SITE_OTHER): Payer: Self-pay | Admitting: Orthopaedic Surgery

## 2018-05-15 ENCOUNTER — Ambulatory Visit (INDEPENDENT_AMBULATORY_CARE_PROVIDER_SITE_OTHER): Payer: Medicare Other | Admitting: Orthopaedic Surgery

## 2018-05-15 DIAGNOSIS — G8929 Other chronic pain: Secondary | ICD-10-CM

## 2018-05-15 DIAGNOSIS — M5431 Sciatica, right side: Secondary | ICD-10-CM | POA: Diagnosis not present

## 2018-05-15 DIAGNOSIS — M5441 Lumbago with sciatica, right side: Secondary | ICD-10-CM

## 2018-05-15 NOTE — Progress Notes (Signed)
The patient comes in today for follow-up after being diagnosed with sciatica.  He 72 years old and is very active.  We reported physical therapy for him but he decided not to go because he does likely be more on his own.  We did put him on a steroid taper muscle aches and he said he is much better from that regimen of medications.  Still more painful to him at night.  He describes still sciatic symptoms.  He denies any weakness in his legs.  He walks without an assistive device and is very active.  He appears younger than his stated age.  On exam he has a positive straight leg raise to the right side but no specific weakness in his right lower extremity or his left lower extremity.  He is very mobile overall.  At this point he will follow-up as needed since he is doing well.  I do feel an MRI is warranted at some point if he continues to have sciatic symptoms and I did show him back extension exercises to try.  If things worsen at all he knows to give [place] a call.  He wants to try to go for several months without doing any other intervention which she can except would be an MRI.

## 2018-05-21 ENCOUNTER — Ambulatory Visit (INDEPENDENT_AMBULATORY_CARE_PROVIDER_SITE_OTHER): Payer: Medicare Other | Admitting: Psychology

## 2018-05-21 DIAGNOSIS — F331 Major depressive disorder, recurrent, moderate: Secondary | ICD-10-CM

## 2018-07-03 ENCOUNTER — Ambulatory Visit: Payer: Medicare Other | Admitting: Psychology

## 2018-07-03 DIAGNOSIS — F331 Major depressive disorder, recurrent, moderate: Secondary | ICD-10-CM

## 2018-07-30 ENCOUNTER — Ambulatory Visit (INDEPENDENT_AMBULATORY_CARE_PROVIDER_SITE_OTHER): Payer: Medicare Other | Admitting: Orthopaedic Surgery

## 2018-07-30 ENCOUNTER — Ambulatory Visit (INDEPENDENT_AMBULATORY_CARE_PROVIDER_SITE_OTHER): Payer: Medicare Other

## 2018-07-30 ENCOUNTER — Encounter (INDEPENDENT_AMBULATORY_CARE_PROVIDER_SITE_OTHER): Payer: Self-pay | Admitting: Orthopaedic Surgery

## 2018-07-30 DIAGNOSIS — M5412 Radiculopathy, cervical region: Secondary | ICD-10-CM

## 2018-07-30 DIAGNOSIS — M5431 Sciatica, right side: Secondary | ICD-10-CM

## 2018-07-30 NOTE — Progress Notes (Signed)
Office Visit Note   Patient: Daniel Rasmussen           Date of Birth: 1946-01-20           MRN: [id number] Visit Date: 07/30/2018              Requested by: Colon Branch, MD [address] [place], [place] [address] PCP: Colon Branch, MD   Assessment & Plan: Visit Diagnoses:  1. Radiculopathy, cervical region   2. Sciatica, right side     Plan: I went over his x-rays of his cervical spine in detail and we reviewed the plain films of his lumbar spine that we took remotely.  He understands that there is significant degenerative changes in both of these areas.  Right now his symptoms are significantly better so we are just going to recommend watching this for now.  From a medication standpoint, I would recommend a steroid taper and Neurontin if it flares up again.  He is also can look for a massage therapist in this area that may be able to help him.  All questions and concerns were answered and addressed.  Follow-up will otherwise be as needed.  My next step would be to obtain an MRI of the lumbar spine and cervical spine if his radicular symptoms worsen.  Follow-Up Instructions: Return if symptoms worsen or fail to improve.   Orders:  Orders Placed This Encounter  Procedures  . XR Cervical Spine 2 or 3 views   No orders of the defined types were placed in this encounter.     Procedures: No procedures performed   Clinical Data: No additional findings.   Subjective: Chief Complaint  Patient presents with  . Neck - Pain  The patient comes in today for evaluation treatment of right-sided arm pain numbness and tingling that is radiating from the neck in the parascapular area.  We have seen him before as well for right-sided sciatica.  He has known significant L4 on L5 spondylolisthesis and lumbar spine.  He was having significant problems from his radicular symptoms for the right side from his neck it was waking him up at night.  He then recently traveled to resort in  [place].  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  Both of those were licensing of medical grade and they helped him greatly.  He says his symptoms are certainly much better and significantly improved as of recent.  He still has some of the symptoms but they are not as bad.  He is not taking any medications for this either.  HPI  Review of Systems He currently denies any headache, chest pain, shortness of breath, fever, chills, nausea, vomiting  Objective: Vital Signs: There were no vitals taken for this visit.  Physical Exam He is alert and orient x3 and in no acute distress Ortho Exam Examination of his cervical spine shows full range of motion.  There is positive Spurling sign to the right side but otherwise a normal neck and shoulder exam.  His triceps and biceps function is strong and normal.  He has slight weak grip strength on his dominant side but otherwise his right extremity exam seems to be normal.  There is some numbness around C6 distribution. Specialty Comments:  No specialty comments available.  Imaging: Xr Cervical Spine 2 Or 3 Views  Result Date: 07/30/2018 2 views of the cervical spine show significant degenerative changes at C5-C6 and C6-C7 with degenerative  disc disease and to space narrowing.  There is also facet arthritis in the lateral facets.    PMFS History: Patient Active Problem List   Diagnosis Date Noted  . Quadriceps tendinitis 03/22/2018  . Elevated BP without diagnosis of hypertension 08/16/2016  . Right knee pain 11/23/2015  . PCP NOTES >>>>>>>>>>>>>>>>>>>>>>>>>>>>>>>>>>>>>>>>> 08/09/2015  . Annual physical exam 07/30/2014  . Tremor 07/30/2014  . Adrenal adenoma 12/25/2012  . Hepatic hemangioma 12/25/2012  . Granulomatous lung disease ([facility] [place]) 12/25/2012  . Spinal stenosis of lumbar region 12/25/2012  . Hyperglycemia 03/15/2012  . Vitamin D deficiency 03/15/2012   Past Medical History:  Diagnosis Date  . Adrenal adenoma  12/25/2012   9 mm,incidental finding on CT 12/22/12   . Diverticulosis   . Epididymitis 02.2010  . Granulomatous lung disease ([place]) 12/25/2012   12/22/12 asymptomatic finding on chest x-ray as calcified granuloma in the posterior right base   . Hemorrhoid    Inflammation  . Hepatic hemangioma 12/25/2012   12/22/12 incidental finding of 3 small masses in the anterior segment of the right lobe of the liver which probably represent hemangioma. The largest lesion measured 1.4 x 1.0 cm in size. Followup recommended in 06/2013 to confirm stability   . Internal hemorrhoids   . Plantar fasciitis 02.2010  . Prediabetes 03/15/2012  . Renal calculi   . Serrated adenoma of colon   . Spinal stenosis of lumbar region 10.2006   L4-L5, Mild discogenic disease  . Tubular adenoma of colon     Family History  Problem Relation Age of Onset  . Aortic aneurysm Father        Deceased  . Breast cancer Mother   . Healthy Sister   . Colon cancer Neg Hx   . Prostate cancer Neg Hx   . Esophageal cancer Neg Hx   . Rectal cancer Neg Hx   . Stomach cancer Neg Hx   . Pancreatic cancer Neg Hx     Past Surgical History:  Procedure Laterality Date  . COLONOSCOPY    . facial plastic surery    . POLYPECTOMY     Social History   Occupational History  . Occupation: Retired, works part time, Armed forces logistics/support/administrative officer  . Smoking status: Former Smoker    Packs/day: 0.25    Years: 10.00    Pack years: 2.50    Types: Cigarettes    Start date: 06/20/1980  . Smokeless tobacco: Never Used  . Tobacco comment: >30 yrs.  Substance and Sexual Activity  . Alcohol use: Yes    Alcohol/week: 21.0 standard drinks    Types: 14 Glasses of wine, 7 Standard drinks or equivalent per week    Comment: martini 1-2 x a week  . Drug use: No  . Sexual activity: Never

## 2018-07-31 DIAGNOSIS — L821 Other seborrheic keratosis: Secondary | ICD-10-CM | POA: Diagnosis not present

## 2018-07-31 DIAGNOSIS — D2239 Melanocytic nevi of other parts of face: Secondary | ICD-10-CM | POA: Diagnosis not present

## 2018-07-31 DIAGNOSIS — L570 Actinic keratosis: Secondary | ICD-10-CM | POA: Diagnosis not present

## 2018-07-31 DIAGNOSIS — D485 Neoplasm of uncertain behavior of skin: Secondary | ICD-10-CM | POA: Diagnosis not present

## 2018-07-31 DIAGNOSIS — I781 Nevus, non-neoplastic: Secondary | ICD-10-CM | POA: Diagnosis not present

## 2018-07-31 DIAGNOSIS — L578 Other skin changes due to chronic exposure to nonionizing radiation: Secondary | ICD-10-CM | POA: Diagnosis not present

## 2018-07-31 DIAGNOSIS — L820 Inflamed seborrheic keratosis: Secondary | ICD-10-CM | POA: Diagnosis not present

## 2018-08-06 DIAGNOSIS — H2513 Age-related nuclear cataract, bilateral: Secondary | ICD-10-CM | POA: Diagnosis not present

## 2018-08-06 DIAGNOSIS — H52223 Regular astigmatism, bilateral: Secondary | ICD-10-CM | POA: Diagnosis not present

## 2018-08-06 DIAGNOSIS — H43813 Vitreous degeneration, bilateral: Secondary | ICD-10-CM | POA: Diagnosis not present

## 2018-08-06 DIAGNOSIS — H5203 Hypermetropia, bilateral: Secondary | ICD-10-CM | POA: Diagnosis not present

## 2018-08-06 DIAGNOSIS — H04123 Dry eye syndrome of bilateral lacrimal glands: Secondary | ICD-10-CM | POA: Diagnosis not present

## 2018-08-07 ENCOUNTER — Ambulatory Visit (INDEPENDENT_AMBULATORY_CARE_PROVIDER_SITE_OTHER): Payer: Medicare Other | Admitting: Psychology

## 2018-08-07 DIAGNOSIS — F331 Major depressive disorder, recurrent, moderate: Secondary | ICD-10-CM

## 2018-09-11 ENCOUNTER — Ambulatory Visit: Payer: Medicare Other | Admitting: Psychology

## 2018-09-12 ENCOUNTER — Encounter: Payer: Self-pay | Admitting: Internal Medicine

## 2018-11-12 ENCOUNTER — Encounter: Payer: Self-pay | Admitting: Internal Medicine

## 2018-11-13 ENCOUNTER — Other Ambulatory Visit: Payer: Self-pay

## 2018-11-13 ENCOUNTER — Encounter: Payer: Self-pay | Admitting: Internal Medicine

## 2018-11-13 ENCOUNTER — Telehealth: Payer: Self-pay

## 2018-11-13 ENCOUNTER — Ambulatory Visit (INDEPENDENT_AMBULATORY_CARE_PROVIDER_SITE_OTHER): Payer: Medicare Other | Admitting: Internal Medicine

## 2018-11-13 DIAGNOSIS — M542 Cervicalgia: Secondary | ICD-10-CM

## 2018-11-13 DIAGNOSIS — N50812 Left testicular pain: Secondary | ICD-10-CM

## 2018-11-13 DIAGNOSIS — B342 Coronavirus infection, unspecified: Secondary | ICD-10-CM

## 2018-11-13 NOTE — Progress Notes (Signed)
Subjective:    Patient ID: Daniel Rasmussen, male    DOB: 1946-04-25, 73 y.o.   MRN: [id number]  DOS:  11/13/2018 Type of visit - description: Virtual Visit via Video Note  I connected with@ on 11/14/18 at  3:20 PM EDT by a video enabled telemedicine application and verified that I am speaking with the correct person using two identifiers.   THIS ENCOUNTER IS A VIRTUAL VISIT DUE TO COVID-19 - PATIENT WAS NOT SEEN IN THE OFFICE. PATIENT HAS CONSENTED TO VIRTUAL VISIT / TELEMEDICINE VISIT   Location of patient: home  Location of provider: office  I discussed the limitations of evaluation and management by telemedicine and the availability of in person appointments. The patient expressed understanding and agreed to proceed.  History of Present Illness: Acute visit Several months history of pain at the neck, right lateral side, some radiation to the shoulder and arm. He did some massage therapy back in January but it did not help much.  Also feels like his glands under the jawline are swollen on and off.  Also the left testicle hurts whenever he ejaculates.  Is not painful otherwise, no swelling that he can tell, no TTP.  Plans to help a friend who is having a cardiac procedure and would like to be tested for COVID-19.  He has no symptoms.  Review of Systems  No fever chills No weight loss or nocturnal chills. No chest pain no difficulty breathing No nausea, vomiting, diarrhea He is active, taking a daily walk, up to 40 minutes. He feels a slightly tired and has some mild frontal hearing sometimes. No cough  No dysuria or gross hematuria Past Medical History:  Diagnosis Date  . Adrenal adenoma 12/25/2012   9 mm,incidental finding on CT 12/22/12   . Diverticulosis   . Epididymitis 02.2010  . Granulomatous lung disease ([place]) 12/25/2012   12/22/12 asymptomatic finding on chest x-ray as calcified granuloma in the posterior right base   . Hemorrhoid    Inflammation  . Hepatic  hemangioma 12/25/2012   12/22/12 incidental finding of 3 small masses in the anterior segment of the right lobe of the liver which probably represent hemangioma. The largest lesion measured 1.4 x 1.0 cm in size. Followup recommended in 06/2013 to confirm stability   . Internal hemorrhoids   . Plantar fasciitis 02.2010  . Prediabetes 03/15/2012  . Renal calculi   . Serrated adenoma of colon   . Spinal stenosis of lumbar region 10.2006   L4-L5, Mild discogenic disease  . Tubular adenoma of colon     Past Surgical History:  Procedure Laterality Date  . COLONOSCOPY    . facial plastic surery    . POLYPECTOMY      Social History   Socioeconomic History  . Marital status: Single    Spouse name: Not on file  . Number of children: 0  . Years of education: Not on file  . Highest education level: Not on file  Occupational History  . Occupation: Retired, works part time, Doctor, hospital  . Financial resource strain: Not on file  . Food insecurity:    Worry: Not on file    Inability: Not on file  . Transportation needs:    Medical: Not on file    Non-medical: Not on file  Tobacco Use  . Smoking status: Former Smoker    Packs/day: 0.25    Years: 10.00    Pack years: 2.50    Types: Cigarettes  Start date: 06/20/1980  . Smokeless tobacco: Never Used  . Tobacco comment: >30 yrs.  Substance and Sexual Activity  . Alcohol use: Yes    Alcohol/week: 21.0 standard drinks    Types: 14 Glasses of wine, 7 Standard drinks or equivalent per week    Comment: martini 1-2 x a week  . Drug use: No  . Sexual activity: Never  Lifestyle  . Physical activity:    Days per week: Not on file    Minutes per session: Not on file  . Stress: Not on file  Relationships  . Social connections:    Talks on phone: Not on file    Gets together: Not on file    Attends religious service: Not on file    Active member of club or organization: Not on file    Attends meetings of clubs or organizations:  Not on file    Relationship status: Not on file  . Intimate partner violence:    Fear of current or ex partner: Not on file    Emotionally abused: Not on file    Physically abused: Not on file    Forced sexual activity: Not on file  Other Topics Concern  . Not on file  Social History Narrative   Lives by himself.      Allergies as of 11/13/2018   No Known Allergies     Medication List       Accurate as of Nov 13, 2018 11:59 PM. If you have any questions, ask your nurse or doctor.        STOP taking these medications   cyclobenzaprine 10 MG tablet Commonly known as:  FLEXERIL Stopped by:  Kathlene November, MD   zolpidem 5 MG tablet Commonly known as:  AMBIEN Stopped by:  Kathlene November, MD     TAKE these medications   methylPREDNISolone 4 MG tablet Commonly known as:  Medrol Take as directed   VITAMIN D PO Take 1,000 Units by mouth daily.           Objective:   Physical Exam There were no vitals taken for this visit. This is a video virtual visit, he is alert oriented x3, no apparent distress    Assessment      Assessment- transfer from Dr Linna Darner 07-2014 Prediabetes Borderline HTN  RBBB- saw cards 08-2015, no further eval MSK:h/o  Spinal stenosis Hepatic hemangioma 2014 incidental finding Adrenal adenoma 2014 incidental finding H/o Ca+ granuloma, chest [place] Ao 08-2015 -- no AAA Vit D def H/o urolithiasis   PLAN Neck pain: As described above, going on for few months. "Gland swelling" at the neck, no fever chills or weight loss.  This is going on for months as well Left testicular pain with ejaculation: Patient reports the area is not swollen or TTP. Plan: Will schedule a face-to-face for further eval of all symptoms.  He is going out of town, will be back ~ mid June. Fortunately, he does not have any acute symptoms that require immediate attention nevertheless he will let me know if something changes. COVID-19 screening: I agree with him, he will help taking care  of her elderly person having a cardiac procedure.  Ordered for screening entered.

## 2018-11-13 NOTE — Telephone Encounter (Signed)
Pt. called.  Requested to schedule appt. For COVID Test.  Reported he saw Dr. Larose Kells today, and was advised to go for testing.  Noted that an order has already been placed.  Scheduled for 8:30 AM, 5/27, at the [facility].  Instructed pt. To wear a mask, and drive up to the testing site; remain in car, and to follow instructions by the staff.  Verb. Understanding.

## 2018-11-14 ENCOUNTER — Other Ambulatory Visit: Payer: Self-pay

## 2018-11-14 DIAGNOSIS — B342 Coronavirus infection, unspecified: Secondary | ICD-10-CM

## 2018-11-14 NOTE — Assessment & Plan Note (Signed)
Neck pain: As described above, going on for few months. "Gland swelling" at the neck, no fever chills or weight loss.  This is going on for months as well Left testicular pain with ejaculation: Patient reports the area is not swollen or TTP. Plan: Will schedule a face-to-face for further eval of all symptoms.  He is going out of town, will be back ~ mid June. Fortunately, he does not have any acute symptoms that require immediate attention nevertheless he will let me know if something changes. COVID-19 screening: I agree with him, he will help taking care of her elderly person having a cardiac procedure.  Ordered for screening entered.

## 2018-11-16 LAB — NOVEL CORONAVIRUS, NAA: SARS-CoV-2, NAA: NOT DETECTED

## 2018-12-14 ENCOUNTER — Ambulatory Visit (HOSPITAL_BASED_OUTPATIENT_CLINIC_OR_DEPARTMENT_OTHER): Admission: RE | Admit: 2018-12-14 | Payer: Medicare Other | Source: Ambulatory Visit

## 2018-12-14 ENCOUNTER — Other Ambulatory Visit: Payer: Self-pay

## 2018-12-14 ENCOUNTER — Ambulatory Visit (HOSPITAL_BASED_OUTPATIENT_CLINIC_OR_DEPARTMENT_OTHER)
Admission: RE | Admit: 2018-12-14 | Discharge: 2018-12-14 | Disposition: A | Discharge status: Home / Self Care | Payer: Medicare Other | Source: Ambulatory Visit | Attending: Internal Medicine | Admitting: Internal Medicine

## 2018-12-14 ENCOUNTER — Ambulatory Visit (INDEPENDENT_AMBULATORY_CARE_PROVIDER_SITE_OTHER): Payer: Medicare Other | Admitting: Internal Medicine

## 2018-12-14 ENCOUNTER — Encounter: Payer: Self-pay | Admitting: Internal Medicine

## 2018-12-14 VITALS — BP 150/69 | HR 49 | Temp 97.6°F | Resp 16 | Ht 66.0 in | Wt 179.2 lb

## 2018-12-14 DIAGNOSIS — N50812 Left testicular pain: Secondary | ICD-10-CM | POA: Diagnosis not present

## 2018-12-14 DIAGNOSIS — M542 Cervicalgia: Secondary | ICD-10-CM | POA: Insufficient documentation

## 2018-12-14 DIAGNOSIS — R030 Elevated blood-pressure reading, without diagnosis of hypertension: Secondary | ICD-10-CM | POA: Diagnosis not present

## 2018-12-14 LAB — URINALYSIS, ROUTINE W REFLEX MICROSCOPIC
Bilirubin Urine: NEGATIVE
Hgb urine dipstick: NEGATIVE
Ketones, ur: NEGATIVE
Leukocytes,Ua: NEGATIVE
Nitrite: NEGATIVE
RBC / HPF: NONE SEEN (ref 0–?)
Specific Gravity, Urine: <=1.005 — AB (ref 1.000–1.030)
Total Protein, Urine: NEGATIVE
Urine Glucose: NEGATIVE
Urobilinogen, UA: 0.2 (ref 0.0–1.0)
pH: 5.5 (ref 5.0–8.0)

## 2018-12-14 MED ORDER — PREDNISONE 10 MG PO TABS: ORAL_TABLET | ORAL | 0 refills | Status: DC

## 2018-12-14 MED FILL — predniSONE 10 MG TABS: 10 | 8 days supply | Qty: 20 | Fill #0

## 2018-12-14 NOTE — Patient Instructions (Addendum)
Provide a urine sample   GO TO THE FRONT DESK Schedule your next appointment for physical exam in 3 months    STOP BY THE FIRST FLOOR:  get the XR   For the shoulder pain: Take prednisone as prescribed  We will refer you to a physical therapy  For the pain at the testicle: We are scheduling a ultrasound

## 2018-12-14 NOTE — Progress Notes (Signed)
Subjective:    Patient ID: Daniel Rasmussen, male    DOB: February 20, 1946, 73 y.o.   MRN: [id number]  DOS:  12/14/2018 Type of visit - description: Acute, multiple symptoms Several months history of right-sided neck pain that radiates down to the trapezoid area. The pain is very persistent and somewhat worse when he moves the neck.  No  problems with the right upper extremity numbness, paresthesias or weakness  Also from time to time the glands under the jaw get "inflammated" and slightly tender. He does not have any dental pain and is check at his dentist 3-4 times a year.  Also for many years have left testicular discomfort and occasionally pain on the left testicle with ejaculation. The scrotal structures are not swollen but is slightly TTP on self palpation. This is going on again for years but only recently become a issue for the patient.  Not completely clear why.  Slightly worse?Marland Kitchen  No previous work-up.   BP Readings from Last 3 Encounters:  12/14/18 (!) 150/69  03/22/18 125/77  09/08/17 126/72     Review of Systems  No fever chills No chest pain no difficulty breathing No nausea, vomiting, diarrhea No dysuria, gross hematuria or difficulty urinating Past Medical History:  Diagnosis Date  . Adrenal adenoma 12/25/2012   9 mm,incidental finding on CT 12/22/12   . Diverticulosis   . Epididymitis 02.2010  . Granulomatous lung disease ([place]) 12/25/2012   12/22/12 asymptomatic finding on chest x-ray as calcified granuloma in the posterior right base   . Hemorrhoid    Inflammation  . Hepatic hemangioma 12/25/2012   12/22/12 incidental finding of 3 small masses in the anterior segment of the right lobe of the liver which probably represent hemangioma. The largest lesion measured 1.4 x 1.0 cm in size. Followup recommended in 06/2013 to confirm stability   . Internal hemorrhoids   . Plantar fasciitis 02.2010  . Prediabetes 03/15/2012  . Renal calculi   . Serrated adenoma of colon   . Spinal  stenosis of lumbar region 10.2006   L4-L5, Mild discogenic disease  . Tubular adenoma of colon     Past Surgical History:  Procedure Laterality Date  . COLONOSCOPY    . facial plastic surery    . POLYPECTOMY      Social History   Socioeconomic History  . Marital status: Single    Spouse name: Not on file  . Number of children: 0  . Years of education: Not on file  . Highest education level: Not on file  Occupational History  . Occupation: Retired, works part time, Doctor, hospital  . Financial resource strain: Not on file  . Food insecurity    Worry: Not on file    Inability: Not on file  . Transportation needs    Medical: Not on file    Non-medical: Not on file  Tobacco Use  . Smoking status: Former Smoker    Packs/day: 0.25    Years: 10.00    Pack years: 2.50    Types: Cigarettes    Start date: 06/20/1980  . Smokeless tobacco: Never Used  . Tobacco comment: >30 yrs.  Substance and Sexual Activity  . Alcohol use: Yes    Alcohol/week: 21.0 standard drinks    Types: 14 Glasses of wine, 7 Standard drinks or equivalent per week    Comment: martini 1-2 x a week  . Drug use: No  . Sexual activity: Never  Lifestyle  . Physical activity  Days per week: Not on file    Minutes per session: Not on file  . Stress: Not on file  Relationships  . Social Herbalist on phone: Not on file    Gets together: Not on file    Attends religious service: Not on file    Active member of club or organization: Not on file    Attends meetings of clubs or organizations: Not on file    Relationship status: Not on file  . Intimate partner violence    Fear of current or ex partner: Not on file    Emotionally abused: Not on file    Physically abused: Not on file    Forced sexual activity: Not on file  Other Topics Concern  . Not on file  Social History Narrative   Lives by himself.      Allergies as of 12/14/2018   No Known Allergies     Medication List        Accurate as of December 14, 2018  9:58 AM. If you have any questions, ask your nurse or doctor.        methylPREDNISolone 4 MG tablet Commonly known as: Medrol Take as directed   VITAMIN D PO Take 1,000 Units by mouth daily.           Objective:   Physical Exam Neck:     BP (!) 150/69 (BP Location: Left Arm, Patient Position: Sitting, Cuff Size: Small)   Pulse (!) 49   Temp 97.6 F (36.4 C) (Oral)   Resp 16   Ht 5\' 6"  (1.676 m)   Wt 179 lb 4 oz (81.3 kg)   SpO2 100%   BMI 28.93 kg/m  General:   Well developed, NAD, BMI noted. HEENT:  Normocephalic . Face symmetric, atraumatic MSK: No TTP at the thoracal cervical spine upon palpation. Neck is full range of motion. Neck: No thyromegaly, no unusual lymph nodes, he has symmetric submandibular glands, likely salivary glands.  See graphic. Oral inspection: No obvious lesions. Lungs:  CTA B Normal respiratory effort, no intercostal retractions, no accessory muscle use. Heart: RRR,  no murmur.  No pretibial edema bilaterally  Skin: Not pale. Not jaundice GU: Penis normal, he has 2 testicles, the left one is normal in size, slightly TTP?Marland Kitchen  Epididymis are prominent but symmetric. No inguinal glands or hernias. Neurologic:  alert & oriented X3.  Speech normal, gait appropriate for age and unassisted. Motor and DTRs symmetric. Psych--  Cognition and judgment appear intact.  Cooperative with normal attention span and concentration.  Behavior appropriate. No anxious or depressed appearing.      Assessment      Assessment- transfer from Dr Linna Darner 07-2014 Prediabetes Borderline HTN  RBBB- saw cards 08-2015, no further eval MSK:h/o  Spinal stenosis Hepatic hemangioma 2014 incidental finding Adrenal adenoma 2014 incidental finding H/o Ca+ granuloma, chest [place] Ao 08-2015 -- no AAA Vit D def H/o urolithiasis   PLAN Here with multiple symptoms: Submandibular gland discomfort: Symptoms are on and off, neck exam is  benign, he see his dentist regularly.  Recommend observation for now, if problems continue refer to ENT Right-sided neck pain: No radiculopathy or myelopathy type of symptoms.  We will get a plain x-ray of the cervical spine, a round of prednisone and referred for physical therapy. Testicular pain, left: Chronic discomfort, increased lately?. If benign except for mild discomfort upon palpation on the left testicle.  Will check a UA, urine culture and scrotal  ultrasound.  He had a normal DRE and PSA 08-2017. Slightly elevated BP today: Typically BPs are normal, reassess on RTC RTC 3 months CPX

## 2018-12-14 NOTE — Progress Notes (Signed)
Pre visit review using our clinic review tool, if applicable. No additional management support is needed unless otherwise documented below in the visit note. 

## 2018-12-15 LAB — URINE CULTURE
MICRO NUMBER:: 612000
Result:: NO GROWTH
SPECIMEN QUALITY:: ADEQUATE

## 2018-12-16 NOTE — Assessment & Plan Note (Signed)
Here with multiple symptoms: Submandibular gland discomfort: Symptoms are on and off, neck exam is benign, he see his dentist regularly.  Recommend observation for now, if problems continue refer to ENT Right-sided neck pain: No radiculopathy or myelopathy type of symptoms.  We will get a plain x-ray of the cervical spine, a round of prednisone and referred for physical therapy. Testicular pain, left: Chronic discomfort, increased lately?. If benign except for mild discomfort upon palpation on the left testicle.  Will check a UA, urine culture and scrotal ultrasound.  He had a normal DRE and PSA 08-2017. Slightly elevated BP today: Typically BPs are normal, reassess on RTC RTC 3 months CPX

## 2018-12-17 ENCOUNTER — Telehealth: Payer: Self-pay

## 2018-12-17 NOTE — Telephone Encounter (Signed)
Copied from [place] [id number]. Topic: Referral - Status >> Dec 17, 2018 12:13 PM Berneta Levins wrote: Reason for CRM:   Jinny Blossom from [facility] calling.  States that she reached out to the pt who doesn't want to have PT at this time and wants to work on his neck on his own. Jinny Blossom can be reached at [phone]

## 2018-12-17 NOTE — Telephone Encounter (Signed)
FYI

## 2018-12-17 NOTE — Telephone Encounter (Signed)
Noted  

## 2019-03-14 ENCOUNTER — Other Ambulatory Visit: Payer: Self-pay

## 2019-03-15 ENCOUNTER — Encounter: Payer: Self-pay | Admitting: Internal Medicine

## 2019-03-15 ENCOUNTER — Ambulatory Visit (INDEPENDENT_AMBULATORY_CARE_PROVIDER_SITE_OTHER): Payer: Medicare Other | Admitting: Internal Medicine

## 2019-03-15 VITALS — BP 157/69 | HR 60 | Temp 97.2°F | Resp 16 | Ht 66.0 in | Wt 179.1 lb

## 2019-03-15 DIAGNOSIS — Z0000 Encounter for general adult medical examination without abnormal findings: Secondary | ICD-10-CM | POA: Diagnosis not present

## 2019-03-15 DIAGNOSIS — R739 Hyperglycemia, unspecified: Secondary | ICD-10-CM | POA: Diagnosis not present

## 2019-03-15 LAB — CBC WITH DIFFERENTIAL/PLATELET
Basophils Absolute: 0 10*3/uL (ref 0.0–0.1)
Basophils Relative: 0.5 % (ref 0.0–3.0)
Eosinophils Absolute: 0.1 10*3/uL (ref 0.0–0.7)
Eosinophils Relative: 2.2 % (ref 0.0–5.0)
HCT: 43.5 % (ref 39.0–52.0)
Hemoglobin: 14.5 g/dL (ref 13.0–17.0)
Lymphocytes Relative: 32.1 % (ref 12.0–46.0)
Lymphs Abs: 1.1 10*3/uL (ref 0.7–4.0)
MCHC: 33.3 g/dL (ref 30.0–36.0)
MCV: 91.4 fl (ref 78.0–100.0)
Monocytes Absolute: 0.5 10*3/uL (ref 0.1–1.0)
Monocytes Relative: 14.1 % — ABNORMAL HIGH (ref 3.0–12.0)
Neutro Abs: 1.7 10*3/uL (ref 1.4–7.7)
Neutrophils Relative %: 51.1 % (ref 43.0–77.0)
Platelets: 157 10*3/uL (ref 150.0–400.0)
RBC: 4.76 Mil/uL (ref 4.22–5.81)
RDW: 12.8 % (ref 11.5–15.5)
WBC: 3.3 10*3/uL — ABNORMAL LOW (ref 4.0–10.5)

## 2019-03-15 LAB — COMPREHENSIVE METABOLIC PANEL
ALT: 12 U/L (ref 0–53)
AST: 19 U/L (ref 0–37)
Albumin: 4.4 g/dL (ref 3.5–5.2)
Alkaline Phosphatase: 47 U/L (ref 39–117)
BUN: 15 mg/dL (ref 6–23)
CO2: 31 mEq/L (ref 19–32)
Calcium: 9.8 mg/dL (ref 8.4–10.5)
Chloride: 99 mEq/L (ref 96–112)
Creatinine, Ser: 0.78 mg/dL (ref 0.40–1.50)
GFR: 97.79 mL/min (ref >60.00)
Glucose, Bld: 109 mg/dL — ABNORMAL HIGH (ref 70–99)
Potassium: 4.6 mEq/L (ref 3.5–5.1)
Sodium: 137 mEq/L (ref 135–145)
Total Bilirubin: 1.1 mg/dL (ref 0.2–1.2)
Total Protein: 6.5 g/dL (ref 6.0–8.3)

## 2019-03-15 LAB — LIPID PANEL
Cholesterol: 181 mg/dL (ref 0–200)
HDL: 65.5 mg/dL (ref >39.00)
LDL Cholesterol: 103 mg/dL — ABNORMAL HIGH (ref 0–99)
NonHDL: 115.54
Total CHOL/HDL Ratio: 3
Triglycerides: 61 mg/dL (ref 0.0–149.0)
VLDL: 12.2 mg/dL (ref 0.0–40.0)

## 2019-03-15 LAB — HEMOGLOBIN A1C: Hgb A1c MFr Bld: 6 % (ref 4.6–6.5)

## 2019-03-15 MED ORDER — SHINGRIX 50 MCG/0.5ML IM SUSR: 0.5000 mL | Freq: Once | INTRAMUSCULAR | 1 refills | Status: AC

## 2019-03-15 MED ORDER — ZOLPIDEM TARTRATE 10 MG PO TABS: 5.0000 mg | ORAL_TABLET | Freq: Every evening | ORAL | 1 refills | Status: AC | PRN

## 2019-03-15 MED FILL — ZOLPIDEM TARTRATE 10 MG TAB: 10 | 30 days supply | Qty: 30 | Fill #0

## 2019-03-15 NOTE — Patient Instructions (Addendum)
Please schedule Medicare Wellness with Glenard Haring.   GO TO THE LAB : Get the blood work     GO TO THE FRONT DESK Schedule your next appointment   For a physical in 1 year    Check the  blood pressure 2 or 3 times a month   BP GOAL is between 110/65 and  135/85. If it is consistently higher or lower, let me know   Take OTC Vitamin D: 1000 or 2000 units a day

## 2019-03-15 NOTE — Progress Notes (Signed)
Pre visit review using our clinic review tool, if applicable. No additional management support is needed unless otherwise documented below in the visit note. 

## 2019-03-15 NOTE — Progress Notes (Signed)
Subjective:    Patient ID: Daniel Rasmussen, male    DOB: 1946/05/01, 73 y.o.   MRN: [id number]  DOS:  03/15/2019 Type of visit - description: cpx Feels well. Last office visit had a number of concerns, they are overall improved. Admits to stress, is selling his house, has poor sleeping, Ambien helped in the past.  Request a prescription.  Review of Systems  Other than above, a 14 point review of systems is negative    Past Medical History:  Diagnosis Date  . Adrenal adenoma 12/25/2012   9 mm,incidental finding on CT 12/22/12   . Diverticulosis   . Epididymitis 02.2010  . Granulomatous lung disease ([place]) 12/25/2012   12/22/12 asymptomatic finding on chest x-ray as calcified granuloma in the posterior right base   . Hemorrhoid    Inflammation  . Hepatic hemangioma 12/25/2012   12/22/12 incidental finding of 3 small masses in the anterior segment of the right lobe of the liver which probably represent hemangioma. The largest lesion measured 1.4 x 1.0 cm in size. Followup recommended in 06/2013 to confirm stability   . Internal hemorrhoids   . Plantar fasciitis 02.2010  . Prediabetes 03/15/2012  . Renal calculi   . Serrated adenoma of colon   . Spinal stenosis of lumbar region 10.2006   L4-L5, Mild discogenic disease  . Tubular adenoma of colon     Past Surgical History:  Procedure Laterality Date  . COLONOSCOPY    . facial plastic surery    . POLYPECTOMY      Social History   Socioeconomic History  . Marital status: Single    Spouse name: Not on file  . Number of children: 0  . Years of education: Not on file  . Highest education level: Not on file  Occupational History  . Occupation: Retired, works part time, Doctor, hospital  . Financial resource strain: Not on file  . Food insecurity    Worry: Not on file    Inability: Not on file  . Transportation needs    Medical: Not on file    Non-medical: Not on file  Tobacco Use  . Smoking status: Former Smoker   Packs/day: 0.25    Years: 10.00    Pack years: 2.50    Types: Cigarettes    Start date: 06/20/1980  . Smokeless tobacco: Never Used  . Tobacco comment: >30 yrs.  Substance and Sexual Activity  . Alcohol use: Yes    Alcohol/week: 21.0 standard drinks    Types: 14 Glasses of wine, 7 Standard drinks or equivalent per week    Comment: martini 1-2 x a week  . Drug use: No  . Sexual activity: Never  Lifestyle  . Physical activity    Days per week: Not on file    Minutes per session: Not on file  . Stress: Not on file  Relationships  . Social Herbalist on phone: Not on file    Gets together: Not on file    Attends religious service: Not on file    Active member of club or organization: Not on file    Attends meetings of clubs or organizations: Not on file    Relationship status: Not on file  . Intimate partner violence    Fear of current or ex partner: Not on file    Emotionally abused: Not on file    Physically abused: Not on file    Forced sexual activity: Not on file  Other Topics Concern  . Not on file  Social History Narrative   Lives by himself.     Family History  Problem Relation Age of Onset  . Aortic aneurysm Father        Deceased  . Breast cancer Mother   . Healthy Sister   . Colon cancer Neg Hx   . Prostate cancer Neg Hx   . Esophageal cancer Neg Hx   . Rectal cancer Neg Hx   . Stomach cancer Neg Hx   . Pancreatic cancer Neg Hx      Allergies as of 03/15/2019   No Known Allergies     Medication List       Accurate as of March 15, 2019 11:59 PM. If you have any questions, ask your nurse or doctor.        STOP taking these medications   methylPREDNISolone 4 MG tablet Commonly known as: Medrol Stopped by: Kathlene November, MD   predniSONE 10 MG tablet Commonly known as: DELTASONE Stopped by: Kathlene November, MD   VITAMIN D PO Stopped by: Kathlene November, MD     TAKE these medications   Shingrix injection Generic drug: Zoster Vaccine Adjuvanted  Inject 0.5 mLs into the muscle once for 1 dose. Started by: Kathlene November, MD   zolpidem 10 MG tablet Commonly known as: Ambien Take 0.5-1 tablets (5-10 mg total) by mouth at bedtime as needed for sleep. Started by: Kathlene November, MD           Objective:   Physical Exam BP (!) 157/69 (BP Location: Left Arm, Patient Position: Sitting, Cuff Size: Small)   Pulse 60   Temp (!) 97.2 F (36.2 C) (Temporal)   Resp 16   Ht 5\' 6"  (1.676 m)   Wt 179 lb 2 oz (81.3 kg)   SpO2 96%   BMI 28.91 kg/m  General: Well developed, NAD, BMI noted Neck: No  thyromegaly  HEENT:  Normocephalic . Face symmetric, atraumatic Lungs:  CTA B Normal respiratory effort, no intercostal retractions, no accessory muscle use. Heart: RRR,  no murmur.  No pretibial edema bilaterally  Abdomen:  Not distended, soft, non-tender. No rebound or rigidity.   Skin: Exposed areas without rash. Not pale. Not jaundice Neurologic:  alert & oriented X3.  Speech normal, gait appropriate for age and unassisted Strength symmetric and appropriate for age.  Psych: Cognition and judgment appear intact.  Cooperative with normal attention span and concentration.  Behavior appropriate. No anxious or depressed appearing.     Assessment      Assessment- transfer from Dr Linna Darner 07-2014 Prediabetes Borderline HTN  RBBB- saw cards 08-2015, no further eval MSK:h/o  Spinal stenosis Hepatic hemangioma 2014 incidental finding Adrenal adenoma 2014 incidental finding H/o Ca+ granuloma, chest [place] Ao 08-2015 -- no AAA Vit D def H/o urolithiasis   PLAN Prediabetes: Lifestyle discussed, check A1c Elevated BP without a diagnosis of HTN: BP slightly elevated, I explained the patient the factors influencing his blood pressure, recommend routine exercise, low-salt diet and monitor BPs.  Medication?  States he will not do it. See last visit, had submandibular gland discomfort, neck pain, testicular pain: Work-up was negative except for neck  DJD on the x-ray.  Overall much improved. Vitamin D deficiency: Encourage supplements RTC 1 year, sooner if needed

## 2019-03-16 NOTE — Assessment & Plan Note (Signed)
Prediabetes: Lifestyle discussed, check A1c Elevated BP without a diagnosis of HTN: BP slightly elevated, I explained the patient the factors influencing his blood pressure, recommend routine exercise, low-salt diet and monitor BPs.  Medication?  States he will not do it. See last visit, had submandibular gland discomfort, neck pain, testicular pain: Work-up was negative except for neck DJD on the x-ray.  Overall much improved. Vitamin D deficiency: Encourage supplements RTC 1 year, sooner if needed

## 2019-03-16 NOTE — Assessment & Plan Note (Signed)
-  Td: 2016 - Prevnar 2016 - pnm 23: 2017 - Had a Zostavax - shingrix  : Rx printed - flu shot: will get elsewhere -Prostate cancer screening: DRE 2019 w/ minimal prostate enlargement ;PSA was wnl -CCS: Cscope  04-2012 showed polyps, Cscope again 11-2016, 5 years per letter  - Diet and exercise: Discussed -Labs: CMP, FLP, CBC, A1c

## 2019-04-05 ENCOUNTER — Encounter: Payer: Self-pay | Admitting: Internal Medicine

## 2019-07-12 ENCOUNTER — Telehealth: Payer: Self-pay | Admitting: Internal Medicine

## 2019-07-12 NOTE — Telephone Encounter (Signed)
Spoke with Daniel Rasmussen regarding AWV. Patient stated he has moved out of [place] and already has a new provider. SF

## 2020-02-10 IMAGING — US ULTRASOUND OF SCROTUM
1 series · 14 of 25 positions shown · non-contrast
Comparison: None

CLINICAL DATA: LEFT testicular pain, soreness, dull achiness, for
4-6 months

EXAM:
SCROTAL ULTRASOUND
DOPPLER ULTRASOUND OF THE TESTICLES
TECHNIQUE: Complete ultrasound examination of the testicles, epididymis, and
other scrotal structures was performed. Color and spectral Doppler
ultrasound were also utilized to evaluate blood flow to the
testicles.

[Series 1: ultrasound of scrotum · 14 of 66 slices shown]
[im 1/66]
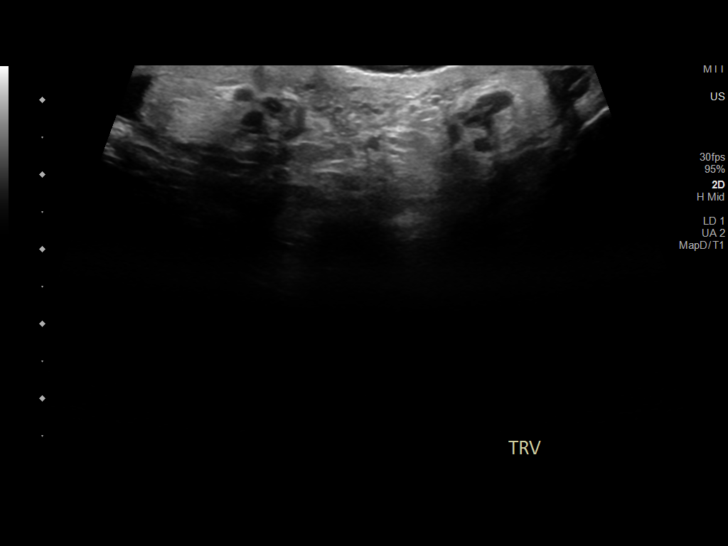
[im 6/66]
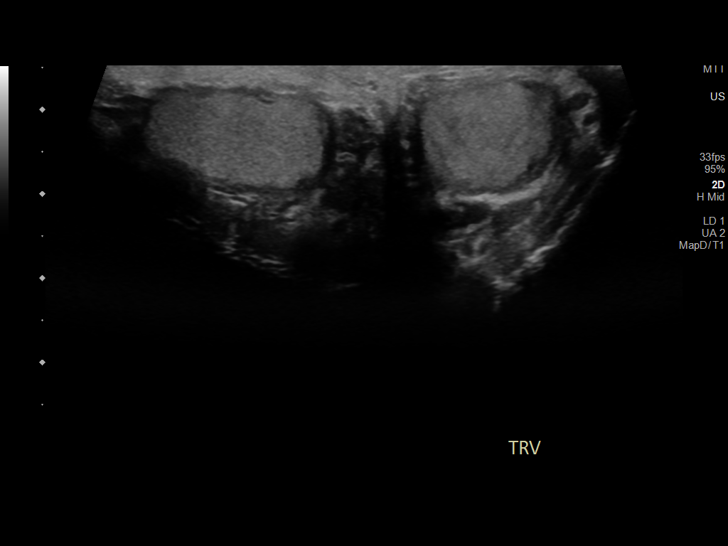
[im 11/66]
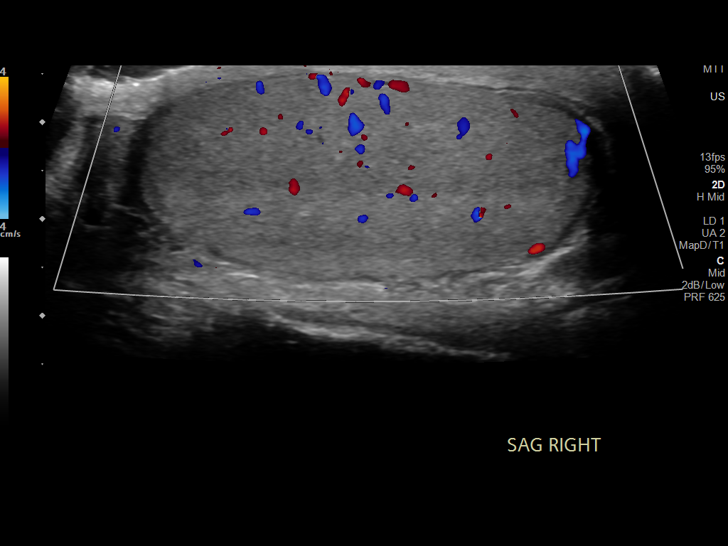
[im 17/66]
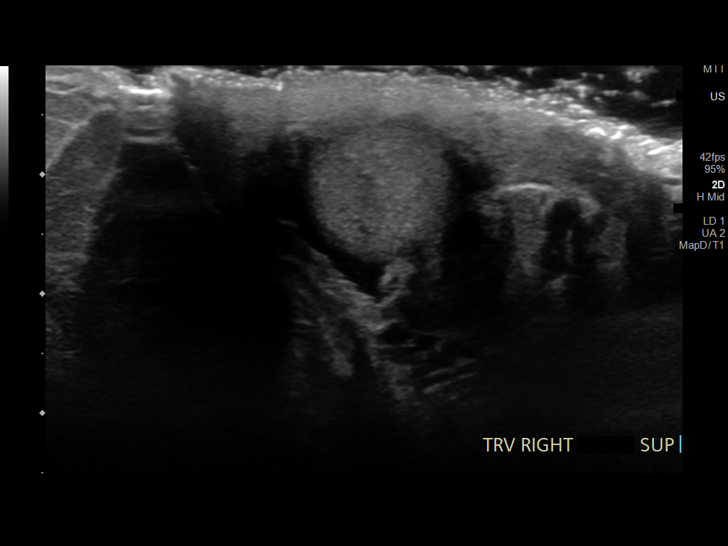
[im 22/66]
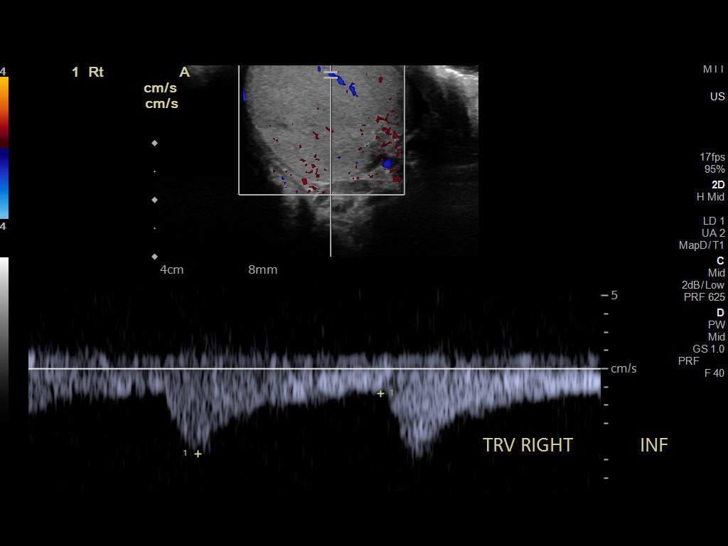
[im 25/66]
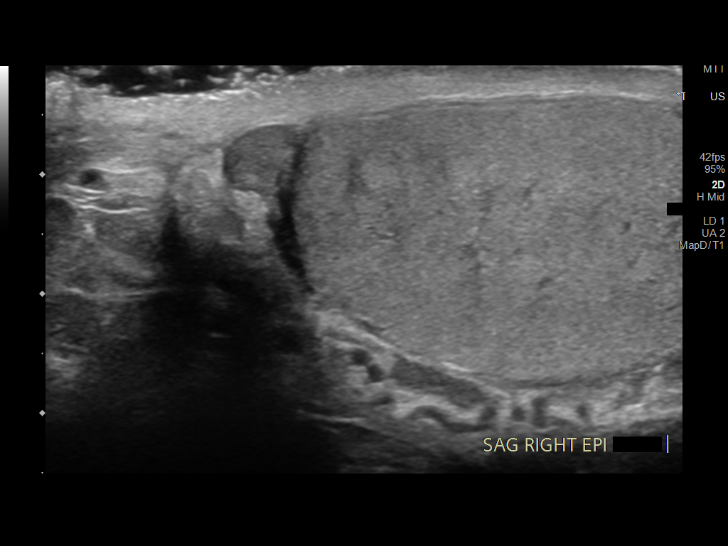
[im 30/66]
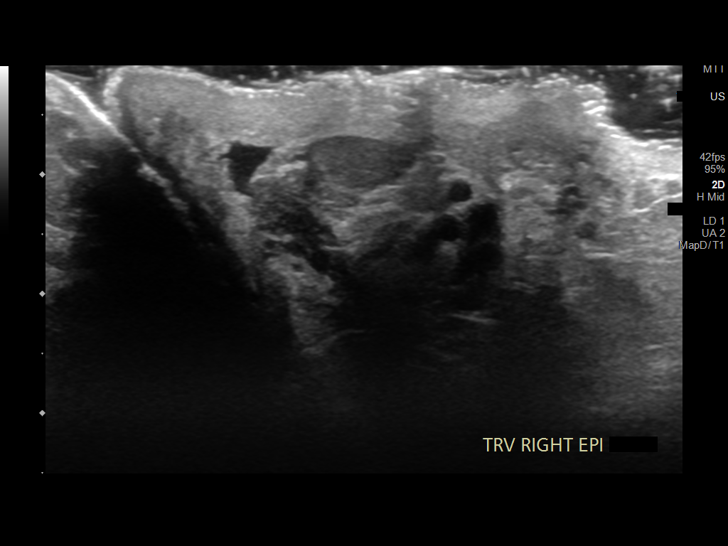
[im 36/66]
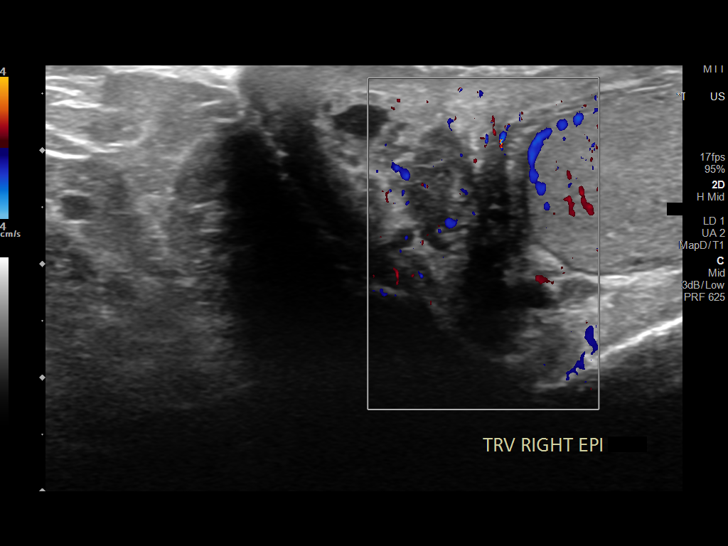
[im 41/66]
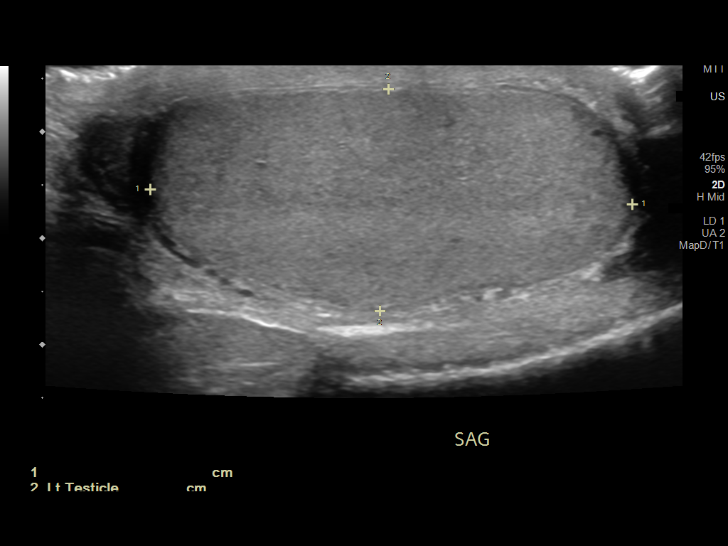
[im 44/66]
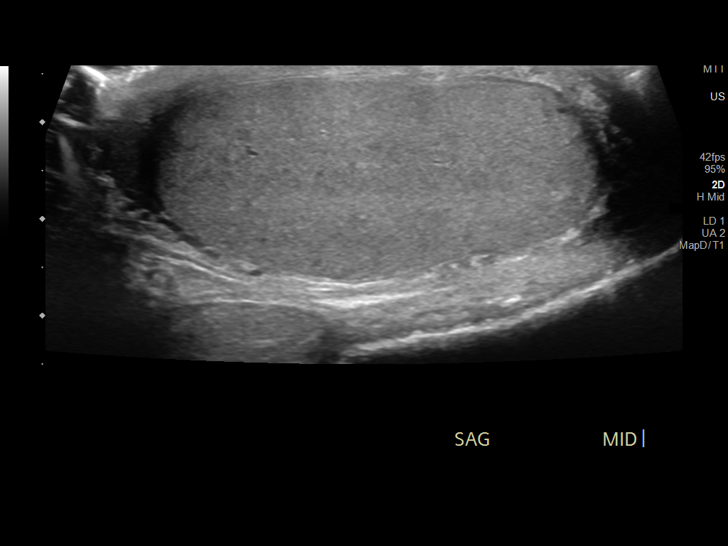
[im 49/66]
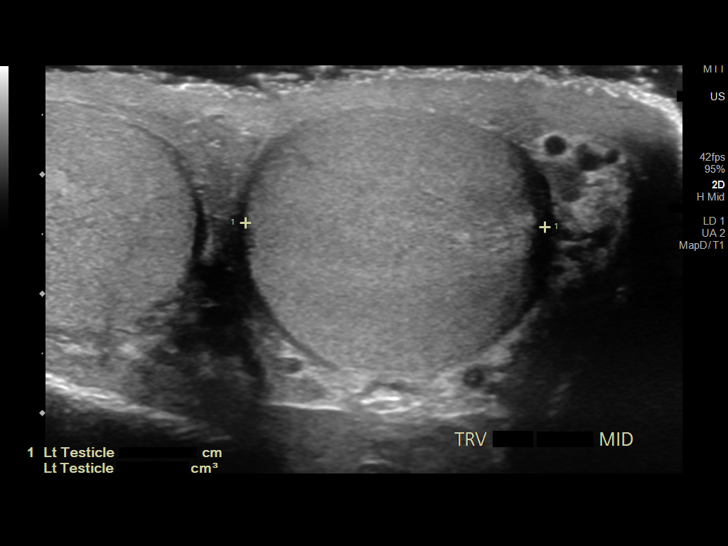
[im 55/66]
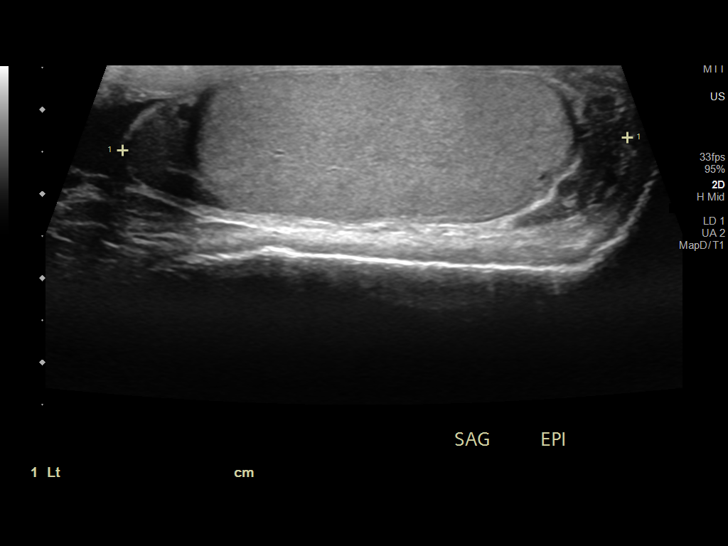
[im 60/66]
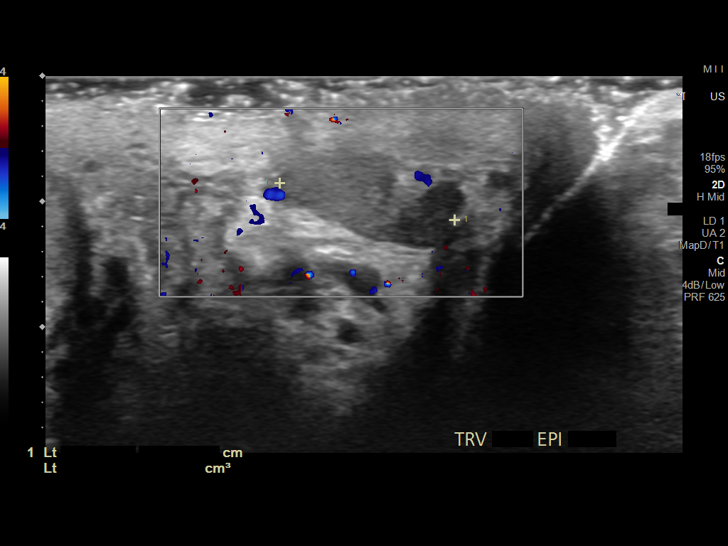
[im 66/66]
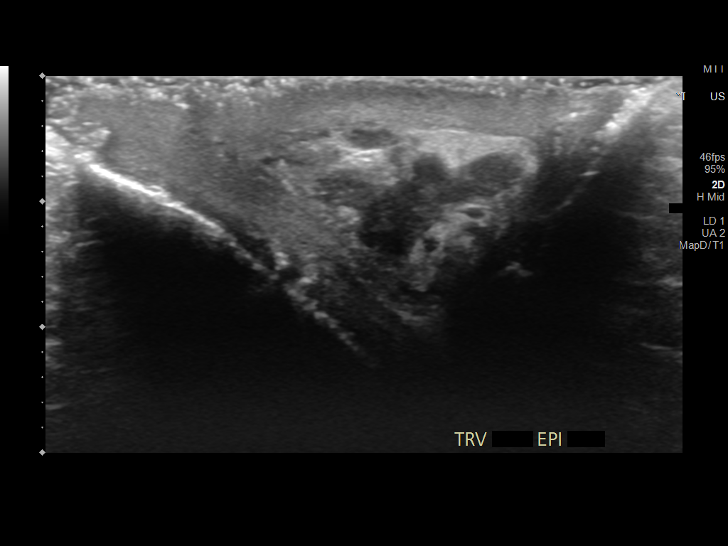

[14 of 25 positions shown; findings below may reference images not displayed]

FINDINGS: Right testicle

Measurements: 4.6 x 2.0 x 2.8 cm. Normal echogenicity without mass
or calcification. Internal blood flow present on color Doppler
imaging.

Left testicle

Measurements: 4.5 x 2.1 x 2.5 cm. Normal echogenicity without mass
or calcification. Internal blood flow present on color Doppler
imaging, symmetric with RIGHT.

Right epididymis:  Normal in size and appearance.

Left epididymis:  Normal in size and appearance.

Hydrocele:  None visualized.

Varicocele:  None visualized.

Pulsed Doppler interrogation of both testes demonstrates normal low
resistance arterial and venous waveforms bilaterally.
IMPRESSION: Normal exam.

## 2022-01-10 ENCOUNTER — Encounter: Payer: Self-pay | Admitting: Internal Medicine
